# Patient Record
Sex: Male | Born: 1975 | Race: Black or African American | Hispanic: No | Marital: Married
Health system: Northeastern US, Academic
[De-identification: ages and names within clinical notes are randomized; demographics above are authoritative.]

## PROBLEM LIST (undated history)

## (undated) DIAGNOSIS — K900 Celiac disease: Secondary | ICD-10-CM

## (undated) DIAGNOSIS — K648 Other hemorrhoids: Secondary | ICD-10-CM

## (undated) DIAGNOSIS — F32A Depression, unspecified: Secondary | ICD-10-CM

## (undated) DIAGNOSIS — G4700 Insomnia, unspecified: Secondary | ICD-10-CM

## (undated) DIAGNOSIS — G4730 Sleep apnea, unspecified: Secondary | ICD-10-CM

## (undated) DIAGNOSIS — G4733 Obstructive sleep apnea (adult) (pediatric): Secondary | ICD-10-CM

## (undated) DIAGNOSIS — K219 Gastro-esophageal reflux disease without esophagitis: Secondary | ICD-10-CM

## (undated) DIAGNOSIS — I10 Essential (primary) hypertension: Secondary | ICD-10-CM

## (undated) DIAGNOSIS — M722 Plantar fascial fibromatosis: Secondary | ICD-10-CM

## (undated) HISTORY — DX: Sleep apnea, unspecified: G47.30

## (undated) HISTORY — DX: Essential (primary) hypertension: I10

## (undated) HISTORY — DX: Insomnia, unspecified: G47.00

## (undated) HISTORY — DX: Plantar fascial fibromatosis: M72.2

## (undated) HISTORY — DX: Other hemorrhoids: K64.8

## (undated) HISTORY — DX: Obstructive sleep apnea (adult) (pediatric): G47.33

## (undated) HISTORY — PX: HERNIA REPAIR: SHX51

## (undated) HISTORY — PX: HEMORRHOID BANDING: SHX5850

## (undated) HISTORY — DX: Gastro-esophageal reflux disease without esophagitis: K21.9

## (undated) HISTORY — DX: Depression, unspecified: F32.A

---

## 2005-06-04 ENCOUNTER — Emergency Department (HOSPITAL_COMMUNITY): Admission: EM | Admit: 2005-06-04 | Discharge: 2005-06-04 | Payer: Self-pay | Admitting: Emergency Medicine

## 2005-08-14 ENCOUNTER — Emergency Department (HOSPITAL_COMMUNITY): Admission: EM | Admit: 2005-08-14 | Discharge: 2005-08-14 | Payer: Self-pay | Admitting: Emergency Medicine

## 2006-04-14 ENCOUNTER — Ambulatory Visit: Payer: Self-pay | Admitting: Psychiatry

## 2006-04-14 ENCOUNTER — Inpatient Hospital Stay (HOSPITAL_COMMUNITY): Admission: AD | Admit: 2006-04-14 | Discharge: 2006-04-19 | Payer: Self-pay | Admitting: Psychiatry

## 2006-04-14 ENCOUNTER — Emergency Department (HOSPITAL_COMMUNITY): Admission: EM | Admit: 2006-04-14 | Discharge: 2006-04-14 | Payer: Self-pay | Admitting: Emergency Medicine

## 2006-04-21 ENCOUNTER — Other Ambulatory Visit (HOSPITAL_COMMUNITY): Admission: RE | Admit: 2006-04-21 | Discharge: 2006-07-20 | Payer: Self-pay | Admitting: Psychiatry

## 2006-08-02 ENCOUNTER — Emergency Department (HOSPITAL_COMMUNITY): Admission: EM | Admit: 2006-08-02 | Discharge: 2006-08-02 | Payer: Self-pay | Admitting: Emergency Medicine

## 2006-08-28 ENCOUNTER — Emergency Department (HOSPITAL_COMMUNITY): Admission: EM | Admit: 2006-08-28 | Discharge: 2006-08-28 | Payer: Self-pay | Admitting: *Deleted

## 2007-03-07 ENCOUNTER — Emergency Department (HOSPITAL_COMMUNITY): Admission: EM | Admit: 2007-03-07 | Discharge: 2007-03-07 | Payer: Self-pay | Admitting: Emergency Medicine

## 2007-10-03 ENCOUNTER — Emergency Department (HOSPITAL_COMMUNITY): Admission: EM | Admit: 2007-10-03 | Discharge: 2007-10-03 | Payer: Self-pay | Admitting: Emergency Medicine

## 2008-02-11 ENCOUNTER — Emergency Department (HOSPITAL_COMMUNITY): Admission: EM | Admit: 2008-02-11 | Discharge: 2008-02-11 | Payer: Self-pay | Admitting: Emergency Medicine

## 2008-04-24 ENCOUNTER — Emergency Department (HOSPITAL_COMMUNITY): Admission: EM | Admit: 2008-04-24 | Discharge: 2008-04-24 | Payer: Self-pay | Admitting: Emergency Medicine

## 2008-05-10 ENCOUNTER — Encounter: Admission: RE | Admit: 2008-05-10 | Discharge: 2008-05-10 | Payer: Self-pay | Admitting: Gastroenterology

## 2008-06-27 ENCOUNTER — Encounter: Admission: RE | Admit: 2008-06-27 | Discharge: 2008-06-27 | Payer: Self-pay | Admitting: General Surgery

## 2008-06-28 ENCOUNTER — Ambulatory Visit (HOSPITAL_BASED_OUTPATIENT_CLINIC_OR_DEPARTMENT_OTHER): Admission: RE | Admit: 2008-06-28 | Discharge: 2008-06-28 | Payer: Self-pay | Admitting: General Surgery

## 2010-02-19 IMAGING — CR DG CHEST 2V
2 series · 2 of 2 positions shown · non-contrast
Comparison: No priors

CLINICAL DATA: Short of breath

CHEST - 2 VIEW

[w chest pa]
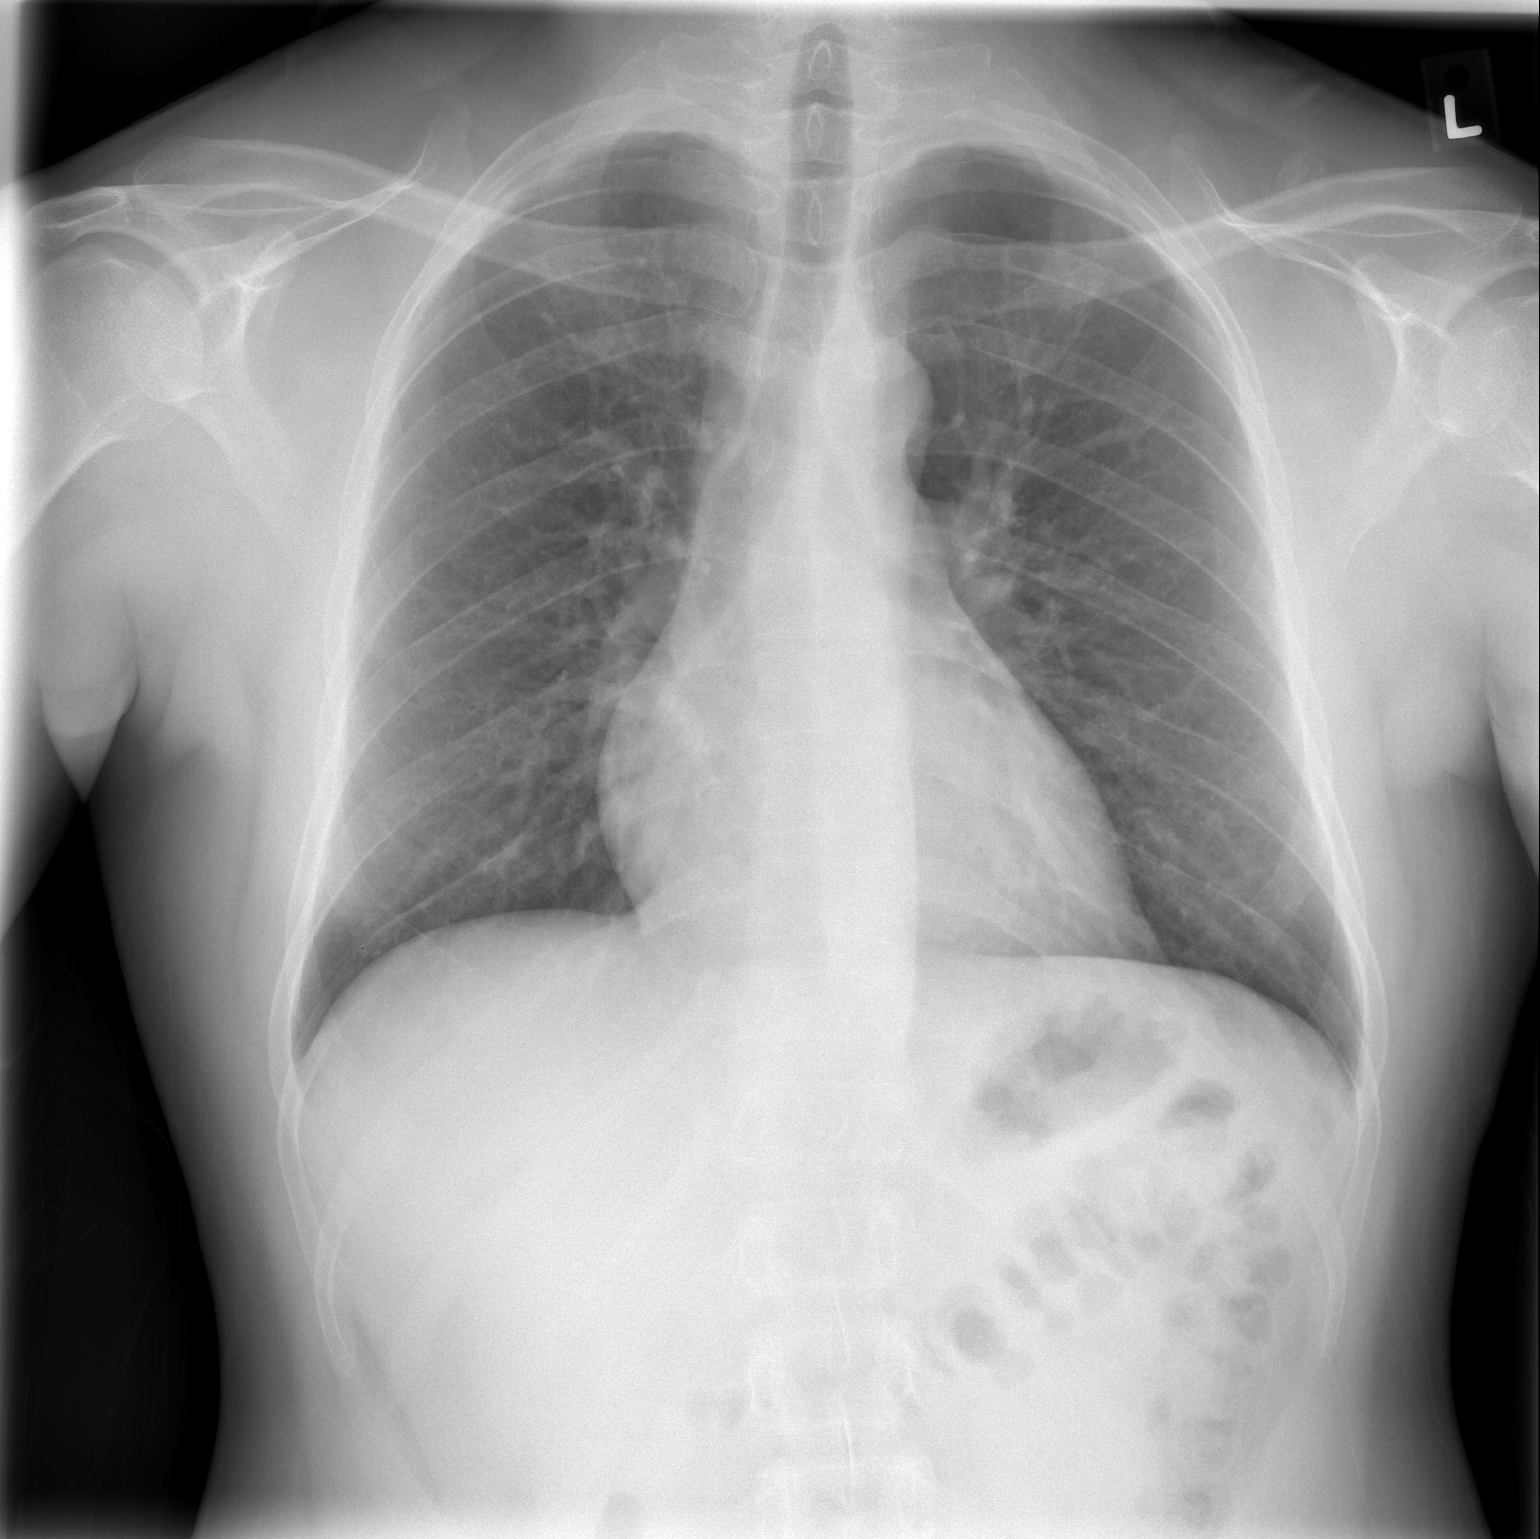

[w chest lat]
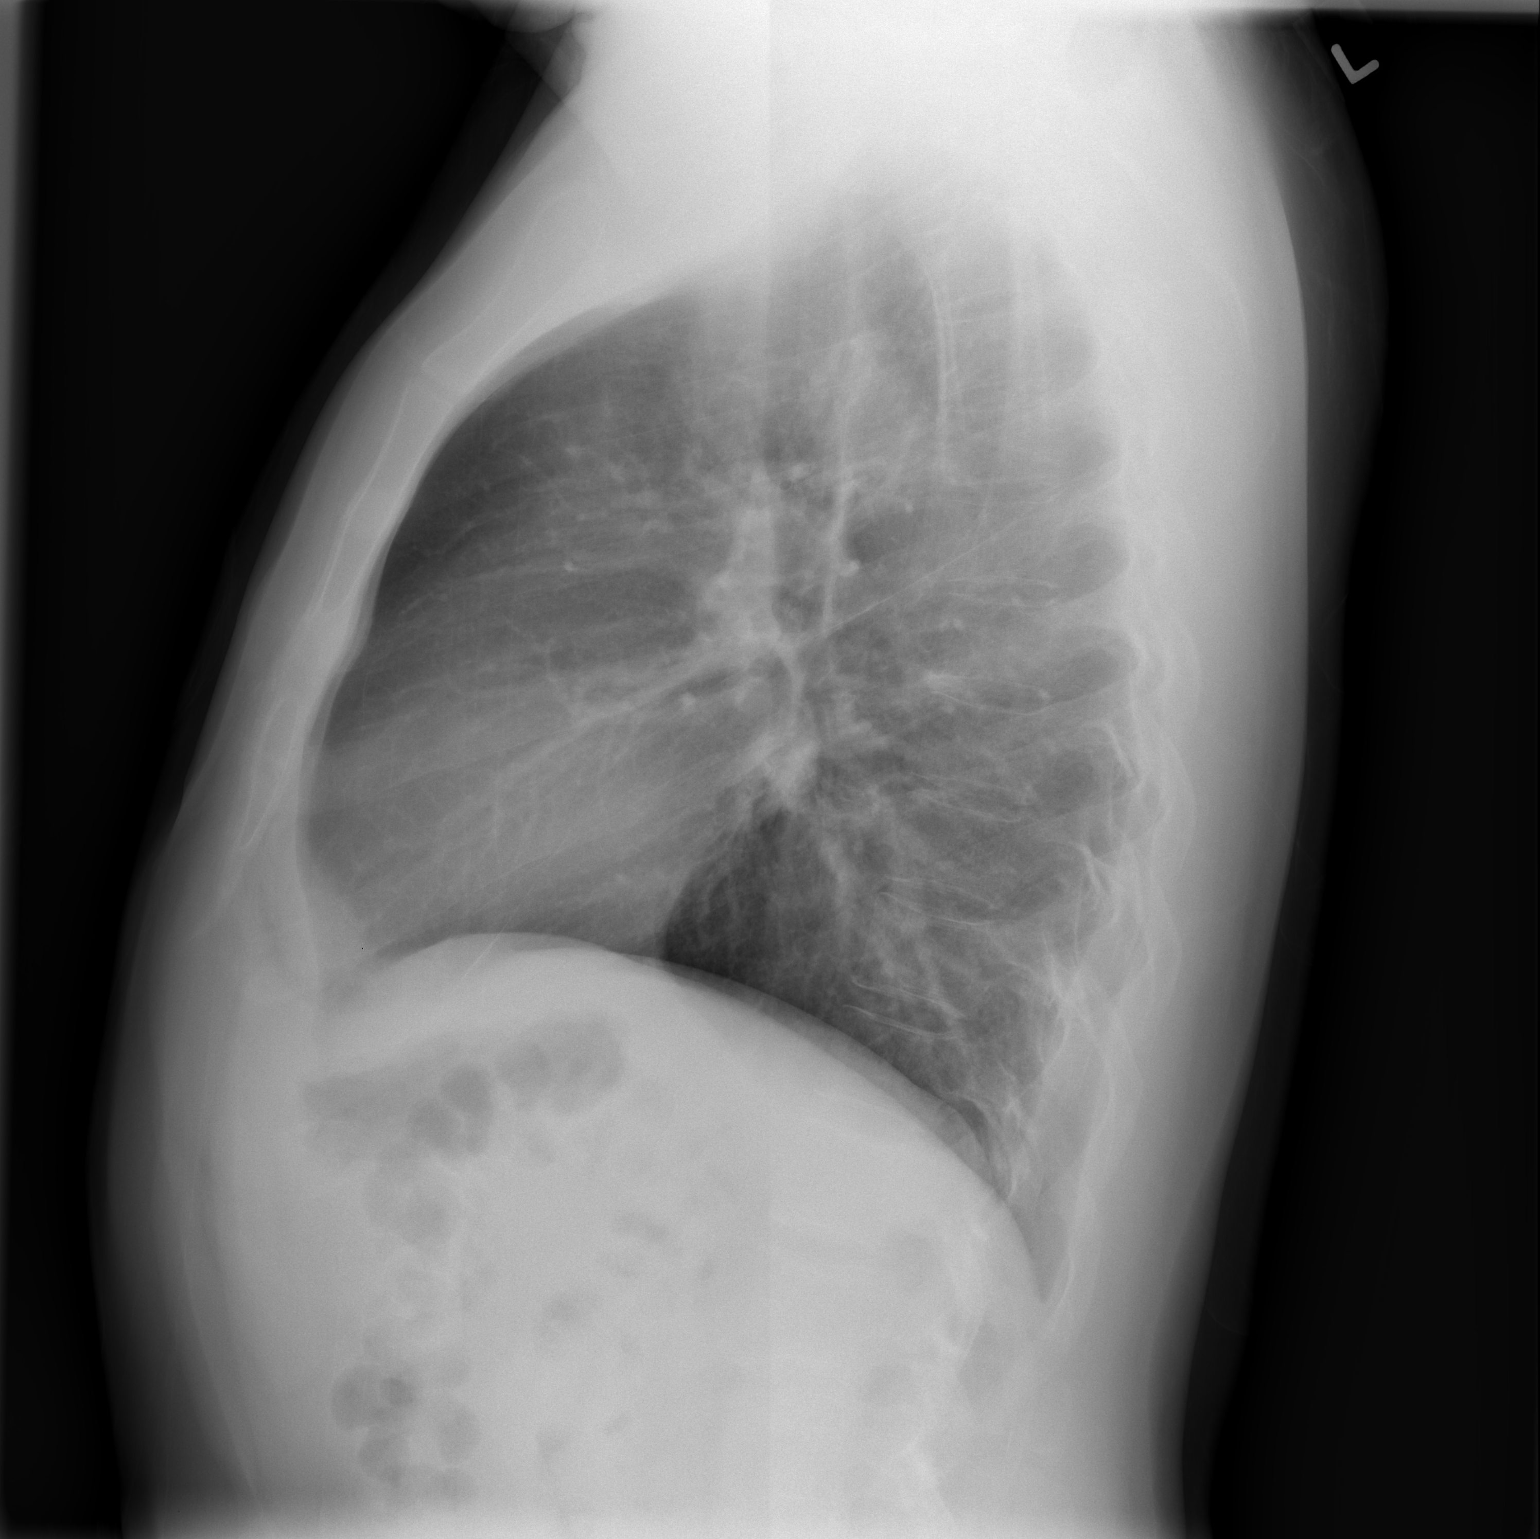

[2 of 2 positions shown; findings below may reference images not displayed]

FINDINGS: Heart and mediastinal contours normal.  Lungs clear.
Osseous structures intact.  No abnormality of the soft tissues.
IMPRESSION: No active disease.

## 2010-08-19 LAB — DIFFERENTIAL
Basophils Absolute: 0 10*3/uL (ref 0.0–0.1)
Basophils Relative: 1 % (ref 0–1)
Eosinophils Relative: 2 % (ref 0–5)
Lymphocytes Relative: 40 % (ref 12–46)
Monocytes Absolute: 0.5 10*3/uL (ref 0.1–1.0)
Monocytes Relative: 9 % (ref 3–12)
Neutrophils Relative %: 49 % (ref 43–77)

## 2010-08-19 LAB — CBC
HCT: 42.9 % (ref 39.0–52.0)
MCHC: 33.6 g/dL (ref 30.0–36.0)
Platelets: 316 10*3/uL (ref 150–400)
RDW: 13.7 % (ref 11.5–15.5)
WBC: 6.4 10*3/uL (ref 4.0–10.5)

## 2010-08-19 LAB — POCT HEMOGLOBIN-HEMACUE: Hemoglobin: 13.9 g/dL (ref 13.0–17.0)

## 2010-08-19 LAB — BASIC METABOLIC PANEL
BUN: 10 mg/dL (ref 6–23)
Calcium: 9.3 mg/dL (ref 8.4–10.5)
Creatinine, Ser: 0.85 mg/dL (ref 0.4–1.5)
GFR calc Af Amer: >60 mL/min (ref >60)

## 2010-09-16 NOTE — Op Note (Signed)
NAME:  Ethan Middleton, Ethan Middleton NO.:  [id number]   MEDICAL RECORD NO.:  [id number]          PATIENT TYPE:  AMB   LOCATION:  DSC                          FACILITY:  [facility]   PHYSICIAN:  Cherylynn Ridges, M.D.    DATE OF BIRTH:  1975-10-25   DATE OF PROCEDURE:  06/28/2008  DATE OF DISCHARGE:                               OPERATIVE REPORT   PREOPERATIVE DIAGNOSIS:  Supraumbilical ventral hernia.   POSTOPERATIVE DIAGNOSIS:  A 2.0-cm supraumbilical ventral hernia.   PROCEDURE:  Primary repair of supraumbilical ventral hernia.   SURGEON:  Marta Lamas. Lindie Spruce, MD   ASSISTANT:  None.   ANESTHESIA:  General with laryngeal airway.   ESTIMATED BLOOD LOSS:  Less 10 mL.   COMPLICATION:  None.   CONDITION:  Stable.   FINDINGS:  A 2.0-cm supraumbilical ventral defect.   INDICATIONS FOR OPERATION:  The patient is a 35 year old with a  symptomatic supraumbilical incarcerated ventral hernia who comes in now  for repair.   OPERATION:  The patient was taken to the operating room and placed on  table in the supine position.  After an adequate general laryngeal  airway anesthetic was administered, he was prepped and draped in usual  sterile manner exposing the umbilical area.   At 5 cm area, a curvilinear incision was drawn above the umbilicus using  marking pen and then a 15 blade was used to make the incision.  We  dissected down into the subcutaneous tissue where we encountered the  incarcerated hernia defect.  We dissected out the hernia defect  circumferentially, then cut off the sac at the fascial edge.  There was  minimal bleeding as we did this with electrocautery.  We grabbed the  fascial edges with Kocher clamps, then repaired using interrupted simple  stitches of 0 Novofil followed by a running back-and-forth stitch of 0  Prolene.  We irrigated with saline solution.  No mesh was used.   We closed with 3-0 Vicryl in the deep layer.  Also, the skin was closed  using running  subcuticular stitch of 4-0 Monocryl.  A 0.5% Marcaine  without epinephrine was injected into the skin.  All counts were  correct.  Dermabond, Steri-Strips, and a plug into the umbilicus  maintaining its concavity were placed as a dressing with a Tegaderm.  All counts were correct.      Cherylynn Ridges, M.D.  Electronically Signed     JOW/MEDQ  D:  06/28/2008  T:  06/29/2008  Job:  [id number]   cc:   Shirley Friar, MD

## 2010-09-19 NOTE — Discharge Summary (Signed)
NAME:  RAHEIM, BEUTLER NO.:  [id number]   MEDICAL RECORD NO.:  [id number]          PATIENT TYPE:  IPS   LOCATION:  0503                          [facility]   PHYSICIAN:  Geoffery Lyons, M.D.      DATE OF BIRTH:  [birth year]   DATE OF ADMISSION:  04/14/2006  DATE OF DISCHARGE:  04/19/2006                               DISCHARGE SUMMARY   ADMITTING DIAGNOSES:   AXIS I:  1. Alcohol and cocaine abuse; rule out dependence.  2. Marijuana dependence.  3. Cannabis dependence.  4. Mood disorder not otherwise specified.   AXIS II:  No diagnosis.   AXIS III:  No diagnosis.   AXIS IV:  Moderate.   AXIS V:  Upon admission 41; his GAF in the last year is 70.   DISCHARGE DIAGNOSES:   AXIS I:  1. Alcohol and cocaine abuse.  2. Marijuana dependence.  3. Mood disorder not otherwise specified.   AXIS II:  No diagnosis.   AXIS III:  No diagnosis.   AXIS IV:  Moderate.   AXIS V:  Upon discharge 55-60.   CHIEF COMPLAINT AND PRESENT ILLNESS:  This was the first admission to  [facility] for this 35 year old, African-American male  presented to the emergency room requesting help with detox after he  awakened in sweat in the morning.  Has been drinking alcohol for years,  drinking more than usual in the last three to four weeks, caught himself  using cocaine and beer recently at 3 o'clock in the morning on the day  of the admission.  His drinking has been exacerbated by a major conflict  with his wife over the past two months he attributes his recent drug and  alcohol use.  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  He began using cocaine to boost his energy to make  sales quotas.  He also has used marijuana for many years and over the  last six months has been drinking six beers every night to go to sleep,  taking two Tylenol-PM along with marijuana, waking up in the middle of  the night sweating and soaking the  sheets.   PAST PSYCHIATRIC HISTORY:  First inpatient stay, first detox, first time  at [facility].  He got counseling when he was in school, but not  formal treatment.  Did talk to his supervisor and was referred for  detox.   ALCOHOL AND DRUG HISTORY:  He has been using alcohol since age 35,  marijuana since approximately age 35, started using cocaine age 35, no IV  drug use.   MEDICAL HISTORY:  Noncontributory.   MEDICATIONS:  None.   Physical exam performed failed to show any acute findings.   LABORATORY WORKUP:  Sodium 140, potassium 3.9, glucose 113, BUN 4,  creatinine 1.0.  Liver enzymes:  SGOT 17, SGPT 14.  CBC:  White blood  cells 7.3, hemoglobin 15.2.   MENTAL STATUS EXAM:  A fully alert, cooperative male, pleasant, and  bright affect.  Speech was somewhat hyperverbal.  His  presence seems to  be in a colorful, elaborate way.  Mood elevated, slightly grandiose at  times.  Thought processes are logical, coherent, and relevant.  No  evidence of delusions.  No active suicidal or homicidal ideas.  No  hallucinations.  Cognition well preserved.   COURSE IN THE HOSPITAL:  He was admitted.  He was started in the  individual and group psychotherapy.  He was detoxified with Librium,  given some Trazodone for sleep.  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  Endorsed decreased sleep, traveling, when the pressures and cocaine then  alcohol, understood that his life was out of control, and he requested  help.  As the hospitalization progressed, he was detoxed, he was able to  settle down, get involved in the group and individual setting.  He heard  that his wife and his boss both were willing to work with him, willing  to give him another chance.  He was encouraged and relieved, and he says  that he loves his wife and would like to stay  in his job.  Was wanting  to leave the hospital as soon as possible as he needed to return to his  home and take care of his family as well as go back to work.  Endorsed  no active withdrawal symptoms.  We continued to work on detox, worked on  relapse prevention.  He endorsed that he was aware of the things that he  needed to do to be sure he did not go back to using.  Did endorse that  he realized how the drugs were affecting his sexual performance, and  this was creating some stress in the relationship.  He had tended to  avoid his wife, making excuses.  He continued to be an active  participant, at times quite superficial in the group setting, but in one-  on-one more real.  Endorsed awareness of his progress, said that he was  wanting to address his addictive behavior in a more rational way,  behavior modification, more so than going to 12-step, felt that 12-step  was not going to work for him.  December 17th, he was in full contact  with reality.  There were no active suicidal or homicidal ideas, no  hallucinations, no delusions, no acute withdrawal, said that he was  ready to be discharged from the unit, will come back to [facility] and  continue to work on self-long term abstinence.   DISCHARGE MEDICATIONS:  None.   FOLLOWUP:  [facility] outpatient.      Geoffery Lyons, M.D.  Electronically Signed     IL/MEDQ  D:  05/11/2006  T:  05/11/2006  Job:  [id number]

## 2010-09-19 NOTE — H&P (Signed)
NAME:  Ethan Middleton, Ethan Middleton NO.:  [id number]   MEDICAL RECORD NO.:  [id number]          PATIENT TYPE:  IPS   LOCATION:  0503                          [facility]ACILITY:  [facility]   PHYSICIAN:  Geoffery Lyons, M.D.      DATE OF BIRTH:  [birth year]   DATE OF ADMISSION:  04/14/2006  DATE OF DISCHARGE:                       PSYCHIATRIC ADMISSION ASSESSMENT   IDENTIFYING INFORMATION:  This is a 35 year old African American male  who is single.  This is a voluntary admission.   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  He has reported that he has been drinking alcohol for  years but drinking more than usual in the last 3-4 weeks and found  himself using cocaine and beer as recently as 3 o'clock in the morning  on the day of admission.  He reports that his drinking has been  exacerbated by marital conflict with his wife over the past 2 months  which he attributes to his recent drug and alcohol use which has  escalated during that time.  He reports that he has been ignoring her.  They had not had much intimacy in the marriage and this has been a  dissatisfied for her.  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  Has always used marijuana for  many years and now has developed a pattern over the course of the last  several months of drinking six beers every night to go to sleep, taking  two Tylenol PM's along with marijuana.  He is waking up in the middle of  the night sweating and soaking the sheets.  He denies suicidal or  homicidal thoughts.  Denies any prior history of detoxes.   PAST PSYCHIATRIC HISTORY:  This is the patient's first inpatient  admission, first detox, first admission to [facility].  He has a history of getting  counseling when he was in schools,  but no formal treatment before.  He did talk to his supervisor and was  referred for detox by his employee assistance program at work.  He  denies any history of mania or mood problems.  No history of suicidal  thoughts or attempts.  Never treated with medications.  He reports using  alcohol since age 70 and cannabis since approximately age 30.  Started  using cocaine approximately age 67 and has used for a couple of years  now.  No IV drug use.  He is either smoking or snorting it.   SOCIAL HISTORY:  The patient currently lives with his fiancee and two  children at home.  He was born and raised in [place] and reports  being exposed to a lot of drug use during his childhood.  He had his  first child at age 17.  Works a full-time job with stable employment and  no current legal problems.   FAMILY  HISTORY:  Remarkable for a family history of substance use.  Alcohol and drug history as noted above.  No history of IV drug use.   The patient has no primary care physician. He denies any medical  problems.  Takes no medications.   PAST MEDICAL HISTORY:  Remarkable for no hospitalizations.  No  surgeries.  No history of seizures, blackouts or memory loss.  No  previous psychiatric history.  He does abuse tobacco smoking between one  to two packs of cigarettes daily.   DRUG ALLERGIES:  None.   POSITIVE PHYSICAL FINDINGS:  The patient's full physical exam was done  in the emergency room.  It is noted in the record.  Is entirely  unremarkable.  This is a well-nourished, well-developed [place] American  male who appears to be his stated age, 5 feet 7 inches tall, 167 pounds,  trim build,  muscular, afebrile.  Pulse 97, respirations 20, blood  pressure 133/77, pulse ox was 100%.   DIAGNOSTIC STUDIES:  The patient's urinalysis was within normal limits.  Urine drug screen was positive for both cocaine and marijuana.  Alcohol  level was less than 5.  A CBC  is entirely normal.  Hemoglobin 15.2,  hematocrit 44.7, MCV 84.9 and platelets 347,000.  His chemistries were  normal.  BUN 4, creatinine 1.0.  Liver enzymes; SGOT 17, SGPT 14,  alkaline phosphatase 60 and total bilirubin 0.8.   MENTAL STATUS EXAM:  Fully alert male, pleasant, cooperative, has a  bright affect.  Speech is somewhat hyperverbal.  Expresses himself with  a rather colorful and elaborate story.  It initially appears that he  might be a little bit hypomanic, however, it is possible that this is  his baseline.  Mood is a bit elevated, slightly grandiose at times.  Thought process is logical, coherent.  No evidence of paranoia or flight  of ideas.  No suicidal or homicidal thought.  Cognitively he is intact  and oriented x3.  No pressure to his speech, fluent, articulate.  Thoughts are well organized.  Insight is adequate.  Impulse control and  judgment appear within normal limits.   AXIS I:  ETOH abuse and dependence.  Cocaine abuse, rule out dependence.  AXIS II:  Deferred.  AXIS III:  No diagnosis.  AXIS IV:  Moderate marital friction and family conflict.  AXIS V:  Current 39, past year 48.   PLAN:  To voluntarily admit the patient for a safe detox in 5 days.  We  started him on a Librium protocol which he is tolerating well.  He is  requesting help with counseling for his marriage and we are going to  attempt to get a family session with his wife.  We have explained the  use of the medications.  Meanwhile while we are detoxing him we will  continue to just observe his mood and interactions for any signs of  lability.  The patient has expressed his agreement with the plan and we  are going to work with him on follow-up services after he leaves here to  maintain sobriety and relapse prevention.  Estimated length of stay is 5  days.      Margaret A. Scott, N.P.      Geoffery Lyons, M.D.  Electronically Signed   MAS/MEDQ  D:  04/15/2006  T:  04/15/2006  Job:   [id number]

## 2011-01-29 LAB — D-DIMER, QUANTITATIVE: D-Dimer, Quant: 0.32

## 2011-02-06 LAB — COMPREHENSIVE METABOLIC PANEL
ALT: 24 U/L (ref 0–53)
AST: 26 U/L (ref 0–37)
Albumin: 4 g/dL (ref 3.5–5.2)
Alkaline Phosphatase: 62 U/L (ref 39–117)
Calcium: 9.3 mg/dL (ref 8.4–10.5)
Creatinine, Ser: 1 mg/dL (ref 0.4–1.5)
Potassium: 3.7 mEq/L (ref 3.5–5.1)
Sodium: 140 mEq/L (ref 135–145)
Total Protein: 7.1 g/dL (ref 6.0–8.3)

## 2011-02-06 LAB — CBC
HCT: 44 % (ref 39.0–52.0)
MCHC: 33.3 g/dL (ref 30.0–36.0)
MCV: 84.3 fL (ref 78.0–100.0)
RBC: 5.21 MIL/uL (ref 4.22–5.81)
WBC: 9.3 10*3/uL (ref 4.0–10.5)

## 2011-02-06 LAB — DIFFERENTIAL
Basophils Absolute: 0 10*3/uL (ref 0.0–0.1)
Lymphocytes Relative: 25 % (ref 12–46)
Lymphs Abs: 2.3 10*3/uL (ref 0.7–4.0)
Monocytes Relative: 10 % (ref 3–12)

## 2011-02-06 LAB — PROTIME-INR
INR: 0.9 (ref 0.00–1.49)
Prothrombin Time: 12.7 seconds (ref 11.6–15.2)

## 2011-02-06 LAB — LIPASE, BLOOD: Lipase: 24 U/L (ref 11–59)

## 2012-12-11 ENCOUNTER — Encounter (HOSPITAL_COMMUNITY): Payer: Self-pay | Admitting: *Deleted

## 2012-12-11 ENCOUNTER — Emergency Department (HOSPITAL_COMMUNITY)
Admission: EM | Admit: 2012-12-11 | Discharge: 2012-12-11 | Disposition: A | Discharge status: Home / Self Care | Payer: BC Managed Care – PPO | Attending: Emergency Medicine | Admitting: Emergency Medicine

## 2012-12-11 DIAGNOSIS — K089 Disorder of teeth and supporting structures, unspecified: Secondary | ICD-10-CM | POA: Insufficient documentation

## 2012-12-11 DIAGNOSIS — K0889 Other specified disorders of teeth and supporting structures: Secondary | ICD-10-CM

## 2012-12-11 DIAGNOSIS — Z8719 Personal history of other diseases of the digestive system: Secondary | ICD-10-CM | POA: Insufficient documentation

## 2012-12-11 DIAGNOSIS — Z792 Long term (current) use of antibiotics: Secondary | ICD-10-CM | POA: Insufficient documentation

## 2012-12-11 HISTORY — DX: Celiac disease: K90.0

## 2012-12-11 MED ORDER — PENICILLIN V POTASSIUM 250 MG PO TABS: 250.0000 mg | ORAL_TABLET | Freq: Four times a day (QID) | ORAL | Status: AC

## 2012-12-11 MED ORDER — HYDROCODONE-ACETAMINOPHEN 5-325 MG PO TABS: 1.0000 | ORAL_TABLET | ORAL | Status: DC | PRN

## 2012-12-11 NOTE — ED Notes (Signed)
Pt reports sore throat started yesterday, this morning nasal stuffiness and reports left sided facial swelling.

## 2012-12-11 NOTE — ED Provider Notes (Signed)
  CSN: [id number]     Arrival date & time 12/11/12  1344 History     First MD Initiated Contact with Patient 12/11/12 1355     Chief Complaint  Patient presents with  . Sore Throat   (Consider location/radiation/quality/duration/timing/severity/associated sxs/prior Treatment) HPI Comments: Pt states that he started with a sore throat yesterday and now he is having pain in his left lower gums and in to his WUJ:WJXBJY fever:pt states that he thinks that he has swelling to the left side of his face:denies fever  Patient is a 37 y.o. male presenting with pharyngitis. The history is provided by the patient. No language interpreter was used.  Sore Throat This is a new problem. The current episode started today. The problem occurs constantly. The problem has been unchanged. Nothing aggravates the symptoms. He has tried nothing for the symptoms.    Past Medical History  Diagnosis Date  . Celiac disease    History reviewed. No pertinent past surgical history. No family history on file. History  Substance Use Topics  . Smoking status: Not on file  . Smokeless tobacco: Not on file  . Alcohol Use: Not on file    Review of Systems  Constitutional: Negative.   Respiratory: Negative.   Cardiovascular: Negative.     Allergies  Review of patient's allergies indicates no known allergies.  Home Medications   Current Outpatient Rx  Name  Route  Sig  Dispense  Refill  . HYDROcodone-acetaminophen (NORCO/VICODIN) 5-325 MG per tablet   Oral   Take 1 tablet by mouth every 4 (four) hours as needed for pain.   10 tablet   0   . penicillin v potassium (VEETID) 250 MG tablet   Oral   Take 1 tablet (250 mg total) by mouth 4 (four) times daily.   40 tablet   0    BP 119/94  Pulse 62  Temp(Src) 98.8 F (37.1 C) (Oral)  Resp 20  SpO2 100% Physical Exam  Nursing note and vitals reviewed. Constitutional: He appears well-developed and well-nourished.  HENT:  Right Ear: External ear  normal.  Left Ear: External ear normal.  Dental decay:no facial swelling noted:left lower gum tenderness  Eyes: Conjunctivae and EOM are normal. Pupils are equal, round, and reactive to light.  Neck: Normal range of motion. Neck supple.  Cardiovascular: Normal rate and regular rhythm.   Pulmonary/Chest: Effort normal and breath sounds normal.    ED Course   Procedures (including critical care time)  Labs Reviewed - No data to display No results found. 1. Toothache     MDM  Pt treated with antibiotics and to follow up with his dentist  Teressa Lower, NP 12/11/12 1445

## 2012-12-11 NOTE — ED Provider Notes (Signed)
Medical screening examination/treatment/procedure(s) were performed by non-physician practitioner and as supervising physician I was immediately available for consultation/collaboration.  Dandrae Kustra M Naz Denunzio, MD 12/11/12 1559 

## 2013-01-22 ENCOUNTER — Emergency Department (HOSPITAL_COMMUNITY): Payer: BC Managed Care – PPO

## 2013-01-22 ENCOUNTER — Encounter (HOSPITAL_COMMUNITY): Payer: Self-pay | Admitting: Emergency Medicine

## 2013-01-22 ENCOUNTER — Emergency Department (HOSPITAL_COMMUNITY)
Admission: EM | Admit: 2013-01-22 | Discharge: 2013-01-22 | Disposition: A | Discharge status: Home / Self Care | Payer: BC Managed Care – PPO | Attending: Emergency Medicine | Admitting: Emergency Medicine

## 2013-01-22 DIAGNOSIS — M549 Dorsalgia, unspecified: Secondary | ICD-10-CM

## 2013-01-22 DIAGNOSIS — F17200 Nicotine dependence, unspecified, uncomplicated: Secondary | ICD-10-CM | POA: Insufficient documentation

## 2013-01-22 DIAGNOSIS — Z8719 Personal history of other diseases of the digestive system: Secondary | ICD-10-CM | POA: Insufficient documentation

## 2013-01-22 DIAGNOSIS — R109 Unspecified abdominal pain: Secondary | ICD-10-CM

## 2013-01-22 DIAGNOSIS — R1032 Left lower quadrant pain: Secondary | ICD-10-CM | POA: Insufficient documentation

## 2013-01-22 DIAGNOSIS — M545 Low back pain, unspecified: Secondary | ICD-10-CM | POA: Insufficient documentation

## 2013-01-22 LAB — CBC WITH DIFFERENTIAL/PLATELET
Basophils Relative: 0 % (ref 0–1)
Eosinophils Relative: 3 % (ref 0–5)
HCT: 44.3 % (ref 39.0–52.0)
Hemoglobin: 14.8 g/dL (ref 13.0–17.0)
MCHC: 33.4 g/dL (ref 30.0–36.0)
Monocytes Absolute: 0.5 10*3/uL (ref 0.1–1.0)
Monocytes Relative: 10 % (ref 3–12)
Neutro Abs: 2.6 10*3/uL (ref 1.7–7.7)
Platelets: 306 10*3/uL (ref 150–400)
RBC: 5.29 MIL/uL (ref 4.22–5.81)
WBC: 5.2 10*3/uL (ref 4.0–10.5)

## 2013-01-22 LAB — URINALYSIS, ROUTINE W REFLEX MICROSCOPIC
Bilirubin Urine: NEGATIVE
Hgb urine dipstick: NEGATIVE
Ketones, ur: NEGATIVE mg/dL
Nitrite: NEGATIVE
Protein, ur: 100 mg/dL — AB
Urobilinogen, UA: 0.2 mg/dL (ref 0.0–1.0)
pH: 5.5 (ref 5.0–8.0)

## 2013-01-22 LAB — LIPASE, BLOOD: Lipase: 26 U/L (ref 11–59)

## 2013-01-22 LAB — COMPREHENSIVE METABOLIC PANEL
ALT: 14 U/L (ref 0–53)
BUN: 9 mg/dL (ref 6–23)
CO2: 25 mEq/L (ref 19–32)
Calcium: 9.3 mg/dL (ref 8.4–10.5)
GFR calc Af Amer: >90 mL/min (ref >90)
Sodium: 137 mEq/L (ref 135–145)
Total Protein: 7.7 g/dL (ref 6.0–8.3)

## 2013-01-22 LAB — URINE MICROSCOPIC-ADD ON

## 2013-01-22 MED ORDER — CYCLOBENZAPRINE HCL 5 MG PO TABS: 5.0000 mg | ORAL_TABLET | Freq: Three times a day (TID) | ORAL | Status: DC | PRN

## 2013-01-22 MED ORDER — OXYCODONE-ACETAMINOPHEN 5-325 MG PO TABS: 1.0000 | ORAL_TABLET | Freq: Four times a day (QID) | ORAL | Status: DC | PRN

## 2013-01-22 MED ORDER — IOHEXOL 300 MG/ML  SOLN
50.0000 mL | Freq: Once | INTRAMUSCULAR | Status: AC | PRN
  Administered 2013-01-22: 50 mL via ORAL

## 2013-01-22 MED ORDER — MORPHINE SULFATE 4 MG/ML IJ SOLN
4.0000 mg | Freq: Once | INTRAMUSCULAR | Status: AC
  Administered 2013-01-22: 4 mg via INTRAVENOUS
  Filled 2013-01-22: qty 1

## 2013-01-22 MED ORDER — IOHEXOL 300 MG/ML  SOLN
100.0000 mL | Freq: Once | INTRAMUSCULAR | Status: AC | PRN
  Administered 2013-01-22: 100 mL via INTRAVENOUS

## 2013-01-22 MED ORDER — ONDANSETRON HCL 4 MG/2ML IJ SOLN
4.0000 mg | Freq: Once | INTRAMUSCULAR | Status: AC
  Administered 2013-01-22: 4 mg via INTRAVENOUS
  Filled 2013-01-22: qty 2

## 2013-01-22 NOTE — ED Notes (Signed)
Patient transported to CT 

## 2013-01-22 NOTE — ED Provider Notes (Signed)
CSN: [id number]     Arrival date & time 01/22/13  0736 History   First MD Initiated Contact with Patient 01/22/13 [phone]     Chief Complaint  Patient presents with  . Abdominal Pain   (Consider location/radiation/quality/duration/timing/severity/associated sxs/prior Treatment) HPI Pt presents with c/o left lower back pain with some left sided lower abdominal pain as well.  Pt states symptoms have been ongoing for the past week, he is traveling tomorrow for work which prompted ED visit today.  No vomiting or fever.  Has passed some looser stools than normal yesterday- no change in the pain.  Pain is worse with sitting up from a lying position and with palpation.  No blood in stools.  No dysuria.  There are no other associated systemic symptoms, there are no other alleviating or modifying factors.   Past Medical History  Diagnosis Date  . Celiac disease    History reviewed. No pertinent past surgical history. No family history on file. History  Substance Use Topics  . Smoking status: Current Every Day Smoker  . Smokeless tobacco: Not on file  . Alcohol Use: No    Review of Systems ROS reviewed and all otherwise negative except for mentioned in HPI  Allergies  Gluten meal  Home Medications   Current Outpatient Rx  Name  Route  Sig  Dispense  Refill  . cyclobenzaprine (FLEXERIL) 5 MG tablet   Oral   Take 1 tablet (5 mg total) by mouth 3 (three) times daily as needed for muscle spasms.   20 tablet   0   . oxyCODONE-acetaminophen (PERCOCET/ROXICET) 5-325 MG per tablet   Oral   Take 1-2 tablets by mouth every 6 (six) hours as needed for pain.   15 tablet   0    BP 114/60  Pulse 54  Temp(Src) 97.6 F (36.4 C) (Oral)  Resp 16  SpO2 100% Vitals reviewed Physical Exam Physical Examination: General appearance - alert, well appearing, and in no distress Mental status - alert, oriented to person, place, and time Eyes - no conjunctival injection, no scleral icterus Mouth -  mucous membranes moist, pharynx normal without lesions Chest - clear to auscultation, no wheezes, rales or rhonchi, symmetric air entry Heart - normal rate, regular rhythm, normal S1, S2, no murmurs, rubs, clicks or gallops Abdomen - soft, left lower abdominal tenderness, no gaurding or rebound, nabs, nondistended, no masses or organomegaly Back- ttp over left lumbar paraspinous muscles, no midline tenderness to palpation Extremities - peripheral pulses normal, no pedal edema, no clubbing or cyanosis Skin - normal coloration and turgor, no rashes  ED Course  Procedures (including critical care time) Labs Review Labs Reviewed  URINALYSIS, ROUTINE W REFLEX MICROSCOPIC - Abnormal; Notable for the following:    Color, Urine AMBER (*)    APPearance CLOUDY (*)    Specific Gravity, Urine 1.039 (*)    Protein, ur 100 (*)    All other components within normal limits  COMPREHENSIVE METABOLIC PANEL - Abnormal; Notable for the following:    Glucose, Bld 103 (*)    All other components within normal limits  CBC WITH DIFFERENTIAL  LIPASE, BLOOD  URINE MICROSCOPIC-ADD ON   Imaging Review Ct Abdomen Pelvis W Contrast  01/22/2013   *RADIOLOGY REPORT*  Clinical Data: Abdomen pain.  Loose stool.  History of celiac disease  CT ABDOMEN AND PELVIS WITH CONTRAST  Technique:  Multidetector CT imaging of the abdomen and pelvis was performed following the standard protocol during bolus administration of  intravenous contrast.  Contrast: OMNIPAQUE IOHEXOL 300 MG/ML  SOLN  Comparison: None.  Findings: The liver, spleen, pancreas, gallbladder, adrenal glands and kidneys are normal.  The aorta is normal.  There is no abdominal lymphadenopathy.  There is no small bowel obstruction or diverticulitis.  The appendix is normal.  Images of the pelvis demonstrate fluid filled bladder without abnormality.  There is mild dependent atelectasis of the posterior lungs.  No acute abnormality is identified within the visualized  bones.  IMPRESSION: No acute abnormality identified in the abdomen and pelvis   Original Report Authenticated By: Sherian Rein, M.D.    MDM   1. Back pain   2. Abdominal pain    Pt presenting with left lower back and left lower abdominal pain.  Labs and CT scan are reassuring, no evidence of colitis or diverticulitis.  Pain in back seems more musculoskeletal in nature.  Will d/w with meds- encouraged outpatient f/u if pain continues.  No signs or symptoms of cauda equina.  Discharged with strict return precautions.  Pt agreeable with plan.    Ethelda Chick, MD 01/22/13 1346

## 2013-01-22 NOTE — ED Notes (Signed)
Per pt, states abdominal pain that radiated to back-loose stool-thought it was gas but has not gotten better

## 2013-02-10 ENCOUNTER — Encounter (HOSPITAL_COMMUNITY): Payer: Self-pay | Admitting: Emergency Medicine

## 2013-02-10 ENCOUNTER — Emergency Department (HOSPITAL_COMMUNITY)
Admission: EM | Admit: 2013-02-10 | Discharge: 2013-02-10 | Disposition: A | Discharge status: Home / Self Care | Payer: BC Managed Care – PPO | Attending: Emergency Medicine | Admitting: Emergency Medicine

## 2013-02-10 DIAGNOSIS — M545 Low back pain, unspecified: Secondary | ICD-10-CM | POA: Insufficient documentation

## 2013-02-10 DIAGNOSIS — H938X9 Other specified disorders of ear, unspecified ear: Secondary | ICD-10-CM | POA: Insufficient documentation

## 2013-02-10 DIAGNOSIS — Z8781 Personal history of (healed) traumatic fracture: Secondary | ICD-10-CM | POA: Insufficient documentation

## 2013-02-10 DIAGNOSIS — M549 Dorsalgia, unspecified: Secondary | ICD-10-CM

## 2013-02-10 DIAGNOSIS — Z8719 Personal history of other diseases of the digestive system: Secondary | ICD-10-CM | POA: Insufficient documentation

## 2013-02-10 DIAGNOSIS — Z87891 Personal history of nicotine dependence: Secondary | ICD-10-CM | POA: Insufficient documentation

## 2013-02-10 MED ORDER — OXYCODONE-ACETAMINOPHEN 5-325 MG PO TABS: 1.0000 | ORAL_TABLET | ORAL | Status: DC | PRN

## 2013-02-10 NOTE — ED Provider Notes (Signed)
CSN: [id number]     Arrival date & time 02/10/13  1238 History   This chart was scribed for non-physician practitioner Sharilyn Sites, PA-C working with Darlys Gales, MD by Joaquin Music, ED Scribe. This patient was seen in room TR06C/TR06C and the patient's care was started at 1:04 PM .   Chief Complaint  Patient presents with  . Back Pain    The history is provided by the patient. No language interpreter was used.   HPI Comments: Ethan Middleton is a 37 y.o. male who presents to the Emergency Department complaining of ongoing worsening left lower back pain. 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 States he has a history of similar pain which is often exacerbated by prolonged sitting, standing, or exertional activity. Pt states he has had to travel an excessive amount for his job recently and the pain has worsened.  Pt states he has tingling sensation radiating down LLE but does not descend past the knee. No numbness or weakness. No loss of bowel or bladder function. Denies any LE edema or calf pain.  No chest pain, SOB, or palpitations.  He states his wife has degenerative disc and he took some of her pain meds with no relief.    Pt also complains of small "knot" behind his left ear.  States he has had something similar in the past which required I&D.  Notes there was pus draining from it the other day but none since.  No hearing disturbance.  No fevers, sweats, or chills.   Past Medical History  Diagnosis Date  . Celiac disease    History reviewed. No pertinent past surgical history. History reviewed. No pertinent family history. History  Substance Use Topics  . Smoking status: Former Games developer  . Smokeless tobacco: Not on file  . Alcohol Use: Yes     Comment: rare    Review of Systems  Musculoskeletal: Positive for myalgias.  All other systems reviewed and are negative.    Allergies  Gluten  meal  Home Medications   Current Outpatient Rx  Name  Route  Sig  Dispense  Refill  . cyclobenzaprine (FLEXERIL) 5 MG tablet   Oral   Take 1 tablet (5 mg total) by mouth 3 (three) times daily as needed for muscle spasms.   20 tablet   0   . oxyCODONE-acetaminophen (PERCOCET/ROXICET) 5-325 MG per tablet   Oral   Take 1-2 tablets by mouth every 6 (six) hours as needed for pain.   15 tablet   0    Triage Vitals:BP 126/83  Pulse 84  Temp(Src) 98.1 F (36.7 C) (Oral)  Resp 15  Ht 5\' 7"  (1.702 m)  Wt 194 lb 14.4 oz (88.406 kg)  BMI 30.52 kg/m2  SpO2 96%  Physical Exam  Nursing note and vitals reviewed. Constitutional: He is oriented to person, place, and time. He appears well-developed and well-nourished. No distress.  HENT:  Head: Normocephalic and atraumatic.  Mouth/Throat: Oropharynx is clear and moist.  Small cystic structure behind left ear; no central fluctuance or active drainage; no surrounding swelling, erythema, or induration  Eyes: Conjunctivae and EOM are normal. Pupils are equal, round, and reactive to light.  Neck: Normal range of motion. Neck supple.  Cardiovascular: Normal rate, regular rhythm and normal heart sounds.   Pulmonary/Chest: Effort normal and breath sounds normal. No respiratory distress. He has no wheezes.  Musculoskeletal: Normal range of  motion.       Lumbar back: He exhibits tenderness, bony tenderness and pain. He exhibits normal range of motion, no swelling, no edema, no deformity, no laceration, no spasm and normal pulse.       Back:  Tenderness to palpation over left SI joint; no step off, crepitus, or gross deformity; limited ROM of hip due to pain; no calf pain, asymmetry, or palpable cord; negative Homan's sign; strong distal pulse; sensation intact; ambulating unassisted without difficulty or limp  Neurological: He is alert and oriented to person, place, and time.  Skin: Skin is warm and dry. He is not diaphoretic.  Psychiatric: He has  a normal mood and affect.    ED Course  Procedures  DIAGNOSTIC STUDIES: Oxygen Saturation is 96% on RA, normal by my interpretation.    COORDINATION OF CARE: 1:10 PM-Discussed treatment plan which includes Percocet and F/U with Orthopedist. Pt agreed to plan.   Labs Review Labs Reviewed - No data to display Imaging Review No results found.   MDM   1. Back pain    Exacerbation of chronic back pain.  No signs/sx concerning for DVT/PE.  No concern for cauda equina.  Cyst behind left ear non-draining without signs of infection or cellulitis-- encouraged warm compresses, FU if no improvement.  Rx percocet.  Pt requests FU with orthopedics-- referral given.  Discussed plan with pt, they agreed.  Return precautions advised.  I personally performed the services described in this documentation, which was scribed in my presence. The recorded information has been reviewed and is accurate.  Garlon Hatchet, PA-C 02/10/13 1349

## 2013-02-10 NOTE — ED Notes (Addendum)
Pt reports one of his legs is shorter and he has had history of pain that he saw ortho md several years ago and was managed with steroid injections. Recently he had to travel a lot for his job and pain has returned in L hip. Ambulatory, cms intact. Took wifes pain meds with no relief. Also c/o sore behind L ear that is painful.

## 2013-02-10 NOTE — ED Provider Notes (Signed)
Medical screening examination/treatment/procedure(s) were performed by non-physician practitioner and as supervising physician I was immediately available for consultation/collaboration.  Darlys Gales, MD 02/10/13 2110

## 2013-04-30 ENCOUNTER — Emergency Department (HOSPITAL_COMMUNITY): Payer: BC Managed Care – PPO

## 2013-04-30 ENCOUNTER — Emergency Department (HOSPITAL_COMMUNITY)
Admission: EM | Admit: 2013-04-30 | Discharge: 2013-04-30 | Disposition: A | Discharge status: Home / Self Care | Payer: BC Managed Care – PPO | Attending: Emergency Medicine | Admitting: Emergency Medicine

## 2013-04-30 ENCOUNTER — Encounter (HOSPITAL_COMMUNITY): Payer: Self-pay | Admitting: Emergency Medicine

## 2013-04-30 DIAGNOSIS — K900 Celiac disease: Secondary | ICD-10-CM | POA: Insufficient documentation

## 2013-04-30 DIAGNOSIS — R5381 Other malaise: Secondary | ICD-10-CM | POA: Insufficient documentation

## 2013-04-30 DIAGNOSIS — IMO0001 Reserved for inherently not codable concepts without codable children: Secondary | ICD-10-CM | POA: Insufficient documentation

## 2013-04-30 DIAGNOSIS — F17200 Nicotine dependence, unspecified, uncomplicated: Secondary | ICD-10-CM | POA: Insufficient documentation

## 2013-04-30 DIAGNOSIS — B9789 Other viral agents as the cause of diseases classified elsewhere: Secondary | ICD-10-CM | POA: Insufficient documentation

## 2013-04-30 DIAGNOSIS — J111 Influenza due to unidentified influenza virus with other respiratory manifestations: Secondary | ICD-10-CM

## 2013-04-30 DIAGNOSIS — J3489 Other specified disorders of nose and nasal sinuses: Secondary | ICD-10-CM | POA: Insufficient documentation

## 2013-04-30 DIAGNOSIS — R509 Fever, unspecified: Secondary | ICD-10-CM | POA: Insufficient documentation

## 2013-04-30 MED ORDER — BENZONATATE 100 MG PO CAPS: 100.0000 mg | ORAL_CAPSULE | Freq: Three times a day (TID) | ORAL | Status: DC | PRN

## 2013-04-30 MED ORDER — FLUTICASONE PROPIONATE 50 MCG/ACT NA SUSP: 2.0000 | Freq: Every day | NASAL | Status: DC

## 2013-04-30 NOTE — ED Provider Notes (Signed)
CSN: [id number]     Arrival date & time 04/30/13  1740 History   First MD Initiated Contact with Patient 04/30/13 1919     Chief Complaint  Patient presents with  . Generalized Body Aches  . Cough   (Consider location/radiation/quality/duration/timing/severity/associated sxs/prior Treatment) Patient is a 37 y.o. male presenting with cough. The history is provided by the patient. No language interpreter was used.  Cough Cough characteristics:  Non-productive and productive Sputum characteristics:  Yellow Severity:  Moderate Duration:  5 days Timing:  Intermittent Associated symptoms: fever   Associated symptoms: no wheezing   Fever:    Duration:  5 days   Timing:  Intermittent Risk factors: recent travel   pt is a 37 year old male who has been having cough, congestion, fever and body aches for approx the last 5-6 days. He reports that he traveled out of state and one of his family members had similar symptoms. He reports nasal congestion and ongoing cough. He denies nausea, vomiting or diarrhea. He denies shortness of breath or difficulty breathing. No history of asthma, he reports that he is trying to quit smoking and is down to 1-2 cigs/day.     Past Medical History  Diagnosis Date  . Celiac disease    History reviewed. No pertinent past surgical history. History reviewed. No pertinent family history. History  Substance Use Topics  . Smoking status: Current Every Day Smoker -- 0.50 packs/day  . Smokeless tobacco: Not on file  . Alcohol Use: Yes     Comment: rare    Review of Systems  Constitutional: Positive for fever and fatigue.  Respiratory: Positive for cough. Negative for wheezing and stridor.   Gastrointestinal: Negative for nausea, vomiting, abdominal pain and diarrhea.  All other systems reviewed and are negative.    Allergies  Gluten meal  Home Medications   Current Outpatient Rx  Name  Route  Sig  Dispense  Refill  . Phenyleph-CPM-DM-APAP  (ALKA-SELTZER PLUS COLD & FLU PO)   Oral   Take 2 capsules by mouth every 8 (eight) hours as needed (COLD/FLU).          BP 147/89  Pulse 74  Temp(Src) 99.3 F (37.4 C) (Oral)  SpO2 93% Physical Exam  Nursing note and vitals reviewed. Constitutional: He is oriented to person, place, and time. He appears well-developed and well-nourished. No distress.  HENT:  Head: Normocephalic and atraumatic.  Eyes: Conjunctivae and EOM are normal.  Neck: Normal range of motion. Neck supple. No JVD present. No tracheal deviation present. No thyromegaly present.  Cardiovascular: Normal rate, regular rhythm and normal heart sounds.   Pulmonary/Chest: Effort normal and breath sounds normal. No respiratory distress. He has no wheezes.  Abdominal: Soft. Bowel sounds are normal. He exhibits no distension. There is no tenderness.  Musculoskeletal: Normal range of motion.  Lymphadenopathy:    He has no cervical adenopathy.  Neurological: He is alert and oriented to person, place, and time.  Skin: Skin is warm and dry.  Psychiatric: He has a normal mood and affect. His behavior is normal. Judgment and thought content normal.    ED Course  Procedures (including critical care time) Labs Review Labs Reviewed - No data to display Imaging Review No results found.  EKG Interpretation   None       MDM   1. Influenza    Cough, congestion, fever and body aches consistent with influenza. Other family members with similar symptoms. Negative chest x-ray. Reassuring exam. Plan discussed  with pt and he agrees. Return precautions given. Home with tessalon and fluticasone. Stable for discharge home.      Irish Elders, NP 05/01/13 0021

## 2013-04-30 NOTE — ED Notes (Signed)
Patient states he has been having body pain, watery eyes, H/A and cough. Patient states he has taken several OTC medications with no improvement. Patient states he is coughing up light green sputum.

## 2013-05-01 NOTE — ED Provider Notes (Signed)
Medical screening examination/treatment/procedure(s) were performed by non-physician practitioner and as supervising physician I was immediately available for consultation/collaboration.  EKG Interpretation   None        Hurman Horn, MD 05/01/13 1408

## 2013-07-04 ENCOUNTER — Emergency Department (HOSPITAL_COMMUNITY)
Admission: EM | Admit: 2013-07-04 | Discharge: 2013-07-04 | Disposition: A | Discharge status: Home / Self Care | Payer: BC Managed Care – PPO | Attending: Emergency Medicine | Admitting: Emergency Medicine

## 2013-07-04 ENCOUNTER — Encounter (HOSPITAL_COMMUNITY): Payer: Self-pay | Admitting: Emergency Medicine

## 2013-07-04 DIAGNOSIS — R638 Other symptoms and signs concerning food and fluid intake: Secondary | ICD-10-CM | POA: Insufficient documentation

## 2013-07-04 DIAGNOSIS — H9209 Otalgia, unspecified ear: Secondary | ICD-10-CM | POA: Insufficient documentation

## 2013-07-04 DIAGNOSIS — Z8719 Personal history of other diseases of the digestive system: Secondary | ICD-10-CM | POA: Insufficient documentation

## 2013-07-04 DIAGNOSIS — Z87891 Personal history of nicotine dependence: Secondary | ICD-10-CM | POA: Insufficient documentation

## 2013-07-04 DIAGNOSIS — R1110 Vomiting, unspecified: Secondary | ICD-10-CM | POA: Insufficient documentation

## 2013-07-04 DIAGNOSIS — J069 Acute upper respiratory infection, unspecified: Secondary | ICD-10-CM | POA: Insufficient documentation

## 2013-07-04 MED ORDER — HYDROCOD POLST-CHLORPHEN POLST 10-8 MG/5ML PO LQCR: 5.0000 mL | Freq: Two times a day (BID) | ORAL | Status: DC | PRN

## 2013-07-04 NOTE — ED Notes (Signed)
Pt states that he has had a cough, nasal congestion, and sore throat x 3 days.

## 2013-07-04 NOTE — ED Provider Notes (Signed)
CSN: [id number]     Arrival date & time 07/04/13  1416 History  This chart was scribed for Elpidio AnisShari Giavanna Kang, PA working with Toy BakerAnthony T Allen, MD by Quintella ReichertMatthew Underwood, ED Scribe. This patient was seen in room WTR9/WTR9 and the patient's care was started at 3:37 PM.   Chief Complaint  Patient presents with  . Nasal Congestion  . Sore Throat  . Cough    The history is provided by the patient. No language interpreter was used.    HPI Comments: Ethan Middleton is a 38 y.o. male who presents to the Emergency Department complaining of 3 days of persistent cough with associated nasal congestion, sore throat, generalized body aches, and ear fullness.  Pt states he has been waking up coughing at night.  His sore throat is described as "burning" and is worsened by swallowing and coughing.  He also reports that he had one episode of emesis last night.  He reports decreased appetite but is tolerating fluids.  He denies fever, diarrhea, rash, or abdominal pain.   Past Medical History  Diagnosis Date  . Celiac disease     Past Surgical History  Procedure Laterality Date  . Hernia repair      History reviewed. No pertinent family history.   History  Substance Use Topics  . Smoking status: Former Smoker -- 0.50 packs/day  . Smokeless tobacco: Not on file  . Alcohol Use: Yes     Comment: rare     Review of Systems  Constitutional: Positive for appetite change. Negative for fever.  HENT: Positive for congestion, ear pain (pressure) and sore throat.   Respiratory: Positive for cough.   Gastrointestinal: Positive for vomiting (one episode). Negative for abdominal pain and diarrhea.  Musculoskeletal: Positive for myalgias.  Skin: Negative for rash.  All other systems reviewed and are negative.      Allergies  Gluten meal  Home Medications   Current Outpatient Rx  Name  Route  Sig  Dispense  Refill  . OVER THE COUNTER MEDICATION      as directed. Cold and flu symptoms.           BP 134/80  Pulse 79  Temp(Src) 99.3 F (37.4 C) (Oral)  Resp 16  SpO2 98%  Physical Exam  Nursing note and vitals reviewed. Constitutional: He is oriented to person, place, and time. He appears well-developed and well-nourished. No distress.  HENT:  Head: Normocephalic and atraumatic.  Right Ear: Tympanic membrane normal.  Left Ear: Tympanic membrane normal.  Mouth/Throat: Uvula is midline. Posterior oropharyngeal erythema present. No oropharyngeal exudate or posterior oropharyngeal edema.  Eyes: EOM are normal.  Neck: Neck supple. No tracheal deviation present.  Cardiovascular: Normal rate, regular rhythm and normal heart sounds.   No murmur heard. Pulmonary/Chest: Effort normal and breath sounds normal. No respiratory distress. He has no wheezes. He has no rales.  Abdominal: Soft. There is no tenderness.  Musculoskeletal: Normal range of motion.  Lymphadenopathy:    He has cervical adenopathy.  Neurological: He is alert and oriented to person, place, and time.  Skin: Skin is warm and dry.  Psychiatric: He has a normal mood and affect. His behavior is normal.    ED Course  Procedures (including critical care time)  DIAGNOSTIC STUDIES: Oxygen Saturation is 98% on room air, normal by my interpretation.    COORDINATION OF CARE: 3:43 PM-Informed pt that symptoms are likely due to self-limiting viral infection.  Discussed treatment plan which includes symptomatic relief with pt  at bedside and pt agreed to plan.     Labs Review Labs Reviewed - No data to display  Imaging Review No results found.   EKG Interpretation None      MDM   Final diagnoses:  None    1. URI  Well appearing patient with likely viral process.     I personally performed the services described in this documentation, which was scribed in my presence. The recorded information has been reviewed and is accurate.     Arnoldo Hooker, PA-C 07/04/13 1712

## 2013-07-04 NOTE — Discharge Instructions (Signed)
Antibiotic Nonuse  Your caregiver felt that the infection or problem was not one that would be helped with an antibiotic. Infections may be caused by viruses or bacteria. Only a caregiver can tell which one of these is the likely cause of an illness. A cold is the most common cause of infection in both adults and children. A cold is a virus. Antibiotic treatment will have no effect on a viral infection. Viruses can lead to many lost days of work caring for sick children and many missed days of school. Children may catch as many as 10 "colds" or "flus" per year during which they can be tearful, cranky, and uncomfortable. The goal of treating a virus is aimed at keeping the ill person comfortable. Antibiotics are medications used to help the body fight bacterial infections. There are relatively few types of bacteria that cause infections but there are hundreds of viruses. While both viruses and bacteria cause infection they are very different types of germs. A viral infection will typically go away by itself within 7 to 10 days. Bacterial infections may spread or get worse without antibiotic treatment. Examples of bacterial infections are:  Sore throats (like strep throat or tonsillitis).  Infection in the lung (pneumonia).  Ear and skin infections. Examples of viral infections are:  Colds or flus.  Most coughs and bronchitis.  Sore throats not caused by Strep.  Runny noses. It is often best not to take an antibiotic when a viral infection is the cause of the problem. Antibiotics can kill off the helpful bacteria that we have inside our body and allow harmful bacteria to start growing. Antibiotics can cause side effects such as allergies, nausea, and diarrhea without helping to improve the symptoms of the viral infection. Additionally, repeated uses of antibiotics can cause bacteria inside of our body to become resistant. That resistance can be passed onto harmful bacterial. The next time you have  an infection it may be harder to treat if antibiotics are used when they are not needed. Not treating with antibiotics allows our own immune system to develop and take care of infections more efficiently. Also, antibiotics will work better for [place] when they are prescribed for bacterial infections. Treatments for a child that is ill may include:  Give extra fluids throughout the day to stay hydrated.  Get plenty of rest.  Only give your child over-the-counter or prescription medicines for pain, discomfort, or fever as directed by your caregiver.  The use of a cool mist humidifier may help stuffy noses.  Cold medications if suggested by your caregiver. Your caregiver may decide to start you on an antibiotic if:  The problem you were seen for today continues for a longer length of time than expected.  You develop a secondary bacterial infection. SEEK MEDICAL CARE IF:  Fever lasts longer than 5 days.  Symptoms continue to get worse after 5 to 7 days or become severe.  Difficulty in breathing develops.  Signs of dehydration develop (poor drinking, rare urinating, dark colored urine).  Changes in behavior or worsening tiredness (listlessness or lethargy). Document Released: 06/29/2001 Document Revised: 07/13/2011 Document Reviewed: 12/26/2008 [facility] Patient Information 2014 [place], [place].  Cough, Adult  A cough is a reflex that helps clear your throat and airways. It can help heal the body or may be a reaction to an irritated airway. A cough may only last 2 or 3 weeks (acute) or may last more than 8 weeks (chronic).  CAUSES Acute cough:  Viral or  bacterial infections. °Chronic cough: °· Infections. °· Allergies. °· Asthma. °· Post-nasal drip. °· Smoking. °· Heartburn or acid reflux. °· Some medicines. °· Chronic lung problems (COPD). °· Cancer. °SYMPTOMS  °· Cough. °· Fever. °· Chest pain. °· Increased breathing rate. °· High-pitched whistling sound when breathing  (wheezing). °· Colored mucus that you cough up (sputum). °TREATMENT  °· A bacterial cough may be treated with antibiotic medicine. °· A viral cough must run its course and will not respond to antibiotics. °· Your caregiver may recommend other treatments if you have a chronic cough. °HOME CARE INSTRUCTIONS  °· Only take over-the-counter or prescription medicines for pain, discomfort, or fever as directed by your caregiver. Use cough suppressants only as directed by your caregiver. °· Use a cold steam vaporizer or humidifier in your bedroom or home to help loosen secretions. °· Sleep in a semi-upright position if your cough is worse at night. °· Rest as needed. °· Stop smoking if you smoke. °SEEK IMMEDIATE MEDICAL CARE IF:  °· You have pus in your sputum. °· Your cough starts to worsen. °· You cannot control your cough with suppressants and are losing sleep. °· You begin coughing up blood. °· You have difficulty breathing. °· You develop pain which is getting worse or is uncontrolled with medicine. °· You have a fever. °MAKE SURE YOU:  °· Understand these instructions. °· Will watch your condition. °· Will get help right away if you are not doing well or get worse. °Document Released: 10/17/2010 Document Revised: 07/13/2011 Document Reviewed: 10/17/2010 °[employer]® Patient Information ©2014 [employer]. °Upper Respiratory Infection, Adult °An upper respiratory infection (URI) is also known as the common cold. It is often caused by a type of germ (virus). Colds are easily spread (contagious). You can pass it to others by kissing, coughing, sneezing, or drinking out of the same glass. Usually, you get better in 1 or 2 weeks.  °HOME CARE  °· Only take medicine as told by your doctor. °· Use a warm mist humidifier or breathe in steam from a hot shower. °· Drink enough water and fluids to keep your pee (urine) clear or pale yellow. °· Get plenty of rest. °· Return to work when your temperature is back to normal or as told  by your doctor. You may use a face mask and wash your hands to stop your cold from spreading. °GET HELP RIGHT AWAY IF:  °· After the first few days, you feel you are getting worse. °· You have questions about your medicine. °· You have chills, shortness of breath, or brown or red spit (mucus). °· You have yellow or brown snot (nasal discharge) or pain in the face, especially when you bend forward. °· You have a fever, puffy (swollen) neck, pain when you swallow, or white spots in the back of your throat. °· You have a bad headache, ear pain, sinus pain, or chest pain. °· You have a high-pitched whistling sound when you breathe in and out (wheezing). °· You have a lasting cough or cough up blood. °· You have sore muscles or a stiff neck. °MAKE SURE YOU:  °· Understand these instructions. °· Will watch your condition. °· Will get help right away if you are not doing well or get worse. °Document Released: 10/07/2007 Document Revised: 07/13/2011 Document Reviewed: 08/25/2010 °[employer]® Patient Information ©2014 [employer]. ° °

## 2013-07-08 NOTE — ED Provider Notes (Signed)
Medical screening examination/treatment/procedure(s) were performed by non-physician practitioner and as supervising physician I was immediately available for consultation/collaboration.   Toy BakerAnthony T Billi Bright, MD 07/08/13 [phone]

## 2013-12-08 ENCOUNTER — Emergency Department (HOSPITAL_COMMUNITY)
Admission: EM | Admit: 2013-12-08 | Discharge: 2013-12-08 | Disposition: A | Discharge status: Home / Self Care | Payer: BC Managed Care – PPO | Attending: Emergency Medicine | Admitting: Emergency Medicine

## 2013-12-08 ENCOUNTER — Encounter (HOSPITAL_COMMUNITY): Payer: Self-pay | Admitting: Emergency Medicine

## 2013-12-08 DIAGNOSIS — Z8719 Personal history of other diseases of the digestive system: Secondary | ICD-10-CM | POA: Insufficient documentation

## 2013-12-08 DIAGNOSIS — R5381 Other malaise: Secondary | ICD-10-CM | POA: Insufficient documentation

## 2013-12-08 DIAGNOSIS — M5412 Radiculopathy, cervical region: Secondary | ICD-10-CM | POA: Insufficient documentation

## 2013-12-08 DIAGNOSIS — R5383 Other fatigue: Secondary | ICD-10-CM

## 2013-12-08 DIAGNOSIS — R209 Unspecified disturbances of skin sensation: Secondary | ICD-10-CM | POA: Insufficient documentation

## 2013-12-08 DIAGNOSIS — Z87891 Personal history of nicotine dependence: Secondary | ICD-10-CM | POA: Insufficient documentation

## 2013-12-08 LAB — CBC WITH DIFFERENTIAL/PLATELET
Basophils Absolute: 0 10*3/uL (ref 0.0–0.1)
Basophils Relative: 1 % (ref 0–1)
Eosinophils Absolute: 0.2 10*3/uL (ref 0.0–0.7)
Eosinophils Relative: 3 % (ref 0–5)
HCT: 41 % (ref 39.0–52.0)
HEMOGLOBIN: 13.6 g/dL (ref 13.0–17.0)
LYMPHS ABS: 2.5 10*3/uL (ref 0.7–4.0)
Lymphocytes Relative: 38 % (ref 12–46)
MCH: 27.8 pg (ref 26.0–34.0)
MCHC: 33.2 g/dL (ref 30.0–36.0)
MCV: 83.7 fL (ref 78.0–100.0)
MONOS PCT: 9 % (ref 3–12)
Monocytes Absolute: 0.6 10*3/uL (ref 0.1–1.0)
Neutro Abs: 3.3 10*3/uL (ref 1.7–7.7)
Neutrophils Relative %: 49 % (ref 43–77)
Platelets: 275 10*3/uL (ref 150–400)
RBC: 4.9 MIL/uL (ref 4.22–5.81)
RDW: 13.9 % (ref 11.5–15.5)
WBC: 6.5 10*3/uL (ref 4.0–10.5)

## 2013-12-08 LAB — COMPREHENSIVE METABOLIC PANEL
ALK PHOS: 70 U/L (ref 39–117)
ALT: 22 U/L (ref 0–53)
ANION GAP: 13 (ref 5–15)
AST: 15 U/L (ref 0–37)
Albumin: 4 g/dL (ref 3.5–5.2)
BILIRUBIN TOTAL: 0.4 mg/dL (ref 0.3–1.2)
BUN: 12 mg/dL (ref 6–23)
CO2: 25 mEq/L (ref 19–32)
CREATININE: 0.74 mg/dL (ref 0.50–1.35)
Calcium: 9.4 mg/dL (ref 8.4–10.5)
Chloride: 100 mEq/L (ref 96–112)
GFR calc non Af Amer: >90 mL/min (ref >90)
GLUCOSE: 101 mg/dL — AB (ref 70–99)
Potassium: 4 mEq/L (ref 3.7–5.3)
Sodium: 138 mEq/L (ref 137–147)
TOTAL PROTEIN: 7.5 g/dL (ref 6.0–8.3)

## 2013-12-08 MED ORDER — PREDNISONE 20 MG PO TABS: 40.0000 mg | ORAL_TABLET | Freq: Every day | ORAL | Status: DC

## 2013-12-08 MED ORDER — PREDNISONE 20 MG PO TABS
60.0000 mg | ORAL_TABLET | Freq: Once | ORAL | Status: AC
  Administered 2013-12-08: 60 mg via ORAL
  Filled 2013-12-08: qty 3

## 2013-12-08 MED ORDER — SODIUM CHLORIDE 0.9 % IV BOLUS (SEPSIS)
1000.0000 mL | Freq: Once | INTRAVENOUS | Status: AC
  Administered 2013-12-08: 1000 mL via INTRAVENOUS

## 2013-12-08 NOTE — Discharge Instructions (Signed)
Cervical Radiculopathy Cervical radiculopathy happens when a nerve in the neck is pinched or bruised by a slipped (herniated) disk or by arthritic changes in the bones of the cervical spine. This can occur due to an injury or as part of the normal aging process. Pressure on the cervical nerves can cause pain or numbness that runs from your neck all the way down into your arm and fingers. CAUSES  There are many possible causes, including:  Injury.  Muscle tightness in the neck from overuse.  Swollen, painful joints (arthritis).  Breakdown or degeneration in the bones and joints of the spine (spondylosis) due to aging.  Bone spurs that may develop near the cervical nerves. SYMPTOMS  Symptoms include pain, weakness, or numbness in the affected arm and hand. Pain can be severe or irritating. Symptoms may be worse when extending or turning the neck. DIAGNOSIS  Your caregiver will ask about your symptoms and do a physical exam. He or she may test your strength and reflexes. X-rays, CT scans, and MRI scans may be needed in cases of injury or if the symptoms do not go away after a period of time. Electromyography (EMG) or nerve conduction testing may be done to study how your nerves and muscles are working. TREATMENT  Your caregiver may recommend certain exercises to help relieve your symptoms. Cervical radiculopathy can, and often does, get better with time and treatment. If your problems continue, treatment options may include:  Wearing a soft collar for short periods of time.  Physical therapy to strengthen the neck muscles.  Medicines, such as nonsteroidal anti-inflammatory drugs (NSAIDs), oral corticosteroids, or spinal injections.  Surgery. Different types of surgery may be done depending on the cause of your problems. HOME CARE INSTRUCTIONS   Put ice on the affected area.  Put ice in a plastic bag.  Place a towel between your skin and the bag.  Leave the ice on for 15-20 minutes,  03-04 times a day or as directed by your caregiver.  If ice does not help, you can try using heat. Take a warm shower or bath, or use a hot water bottle as directed by your caregiver.  You may try a gentle neck and shoulder massage.  Use a flat pillow when you sleep.  Only take over-the-counter or prescription medicines for pain, discomfort, or fever as directed by your caregiver.  If physical therapy was prescribed, follow your caregiver's directions.  If a soft collar was prescribed, use it as directed. SEEK IMMEDIATE MEDICAL CARE IF:   Your pain gets much worse and cannot be controlled with medicines.  You have weakness or numbness in your hand, arm, face, or leg.  You have a high fever or a stiff, rigid neck.  You lose bowel or bladder control (incontinence).  You have trouble with walking, balance, or speaking. MAKE SURE YOU:   Understand these instructions.  Will watch your condition.  Will get help right away if you are not doing well or get worse. Document Released: 01/13/2001 Document Revised: 07/13/2011 Document Reviewed: 12/02/2010 [facility] Patient Information 2015 [place]. This information is not intended to replace advice given to you by your health care provider. Make sure you discuss any questions you have with your health care provider. Thyroid-Stimulating Hormone A thyroid-stimulating hormone (TSH) test is a blood test that is done to measure the level of TSH, also known as thyrotropin, in your blood. Knowing the level of TSH in your blood can help your health care provider:  Diagnose a thyroid gland or pituitary gland disorder.  Manage your condition and treatment if you have hypothyroidism or hyperthyroidism. TSH is produced by the pituitary gland. The pituitary gland is a small organ located just below the brain, behind your eyes and nasal passages. It is part of a system that monitors and maintains thyroid hormone levels and thyroid gland function.  Thyroid hormones affect many body parts and systems, including the system that affects how quickly your body burns fuel for energy. Your health care provider may recommend testing your TSH level if you have signs and symptoms that are often associated with abnormal thyroid hormone levels. The blood used for testing is obtained through a needle placed in a vein in your arm. RESULTS It is your responsibility to obtain your test results. Ask the lab or department performing the test when and how you will get your results. Contact your health care provider to discuss any questions you have about your results.  Your health care provider will go over the test results with you and discuss the importance and meaning of your results, as well as the need for additional tests if necessary. Range of Normal Values  Ranges for normal values may vary among different labs and hospitals. You should always check with your health care provider after having lab work or other tests done to discuss whether your values are considered within normal limits.  Adult: 0.3-5 microU/mL or 0.3-5 mU/L (SI units).  Newborn: 3-18 microU/mL or 3-18 mU/L.  Cord: 3-12 microU/mL or 3-12 mU/L. Meaning of Results Outside Normal Value Ranges A high level of TSH may mean:  Your thyroid gland is not making enough thyroid hormones. When the thyroid gland does not make enough thyroid hormones, the pituitary gland releases TSH into the bloodstream. The higher-than-normal levels of TSH prompt the thyroid gland to release more thyroid hormones.  You are getting an insufficient level of thyroid hormone medicine, if you are receiving this type of treatment.  There is a problem with the pituitary gland (rare). A low level of TSH can indicate a problem with the pituitary gland. Document Released: 05/15/2004 Document Revised: 09/04/2013 Document Reviewed: 04/01/2008 [facility] Patient Information 2015 [place], [place]. This information is not  intended to replace advice given to you by your health care provider. Make sure you discuss any questions you have with your health care provider.

## 2013-12-08 NOTE — ED Notes (Signed)
PA at bedside.

## 2013-12-08 NOTE — ED Provider Notes (Signed)
CSN: [id number]     Arrival date & time 12/08/13  1819 History   First MD Initiated Contact with Patient 12/08/13 2007     Chief Complaint  Patient presents with  . Multiple complaints     dry mouth, fatigue     (Consider location/radiation/quality/duration/timing/severity/associated sxs/prior Treatment) HPI Comments: Patient presents to the emergency department with multiple complaints.  1: Generalized fatigue. Patient states that he's been feeling fatigued for the past couple of months. He states that he is very hectic work schedule. He states he lacks energy that he used to have. He denies any associated fevers, chills, nausea, vomiting, diarrhea, constipation. He has not taken anything to alleviate his symptoms. There are no aggravating or relieving factors.  2: Left hand numbness. Patient states that for the last month he has had some numbness and tingling in his left hand, specifically in the fourth and fifth fingers. He states that this improves with massage of his neck, as well as with head movement. He has not taken anything to alleviate his symptoms. There are no aggravating or relieving factors.  The history is provided by the patient. No language interpreter was used.    Past Medical History  Diagnosis Date  . Celiac disease    Past Surgical History  Procedure Laterality Date  . Hernia repair     History reviewed. No pertinent family history. History  Substance Use Topics  . Smoking status: Former Smoker -- 0.50 packs/day  . Smokeless tobacco: Not on file  . Alcohol Use: Yes     Comment: social    Review of Systems  All other systems reviewed and are negative.     Allergies  Gluten meal  Home Medications   Prior to Admission medications   Medication Sig Start Date End Date Taking? Authorizing Provider  predniSONE (DELTASONE) 20 MG tablet Take 2 tablets (40 mg total) by mouth daily. 12/08/13   Roxy Horseman, PA-C   BP 129/95  Pulse 67  Temp(Src) 98.5 F  (36.9 C) (Oral)  Resp 20  SpO2 98% Physical Exam  Nursing note and vitals reviewed. Constitutional: He is oriented to person, place, and time. He appears well-developed and well-nourished. No distress.  HENT:  Head: Normocephalic and atraumatic.  Moist mucous membranes  Eyes: Conjunctivae and EOM are normal. Pupils are equal, round, and reactive to light. Right eye exhibits no discharge. Left eye exhibits no discharge. No scleral icterus.  Neck: Normal range of motion. Neck supple. No JVD present.  Cardiovascular: Normal rate, regular rhythm, normal heart sounds and intact distal pulses.  Exam reveals no gallop and no friction rub.   No murmur heard. Intact distal pulses with brisk capillary refill  Pulmonary/Chest: Effort normal and breath sounds normal. No respiratory distress. He has no wheezes. He has no rales. He exhibits no tenderness.  Abdominal: Soft. He exhibits no distension and no mass. There is no tenderness. There is no rebound and no guarding.  Musculoskeletal: Normal range of motion. He exhibits no edema and no tenderness.  Moves all extremities   Neurological: He is alert and oriented to person, place, and time.  Sensation and strength intact.  Skin: Skin is warm and dry.  Psychiatric: He has a normal mood and affect. His behavior is normal. Judgment and thought content normal.    ED Course  Procedures (including critical care time) Results for orders placed during the hospital encounter of 12/08/13  CBC WITH DIFFERENTIAL      Result Value Ref Range  WBC 6.5  4.0 - 10.5 K/uL   RBC 4.90  4.22 - 5.81 MIL/uL   Hemoglobin 13.6  13.0 - 17.0 g/dL   HCT 65.741.0  84.639.0 - 96.252.0 %   MCV 83.7  78.0 - 100.0 fL   MCH 27.8  26.0 - 34.0 pg   MCHC 33.2  30.0 - 36.0 g/dL   RDW 95.213.9  84.111.5 - 32.415.5 %   Platelets 275  150 - 400 K/uL   Neutrophils Relative % 49  43 - 77 %   Neutro Abs 3.3  1.7 - 7.7 K/uL   Lymphocytes Relative 38  12 - 46 %   Lymphs Abs 2.5  0.7 - 4.0 K/uL   Monocytes  Relative 9  3 - 12 %   Monocytes Absolute 0.6  0.1 - 1.0 K/uL   Eosinophils Relative 3  0 - 5 %   Eosinophils Absolute 0.2  0.0 - 0.7 K/uL   Basophils Relative 1  0 - 1 %   Basophils Absolute 0.0  0.0 - 0.1 K/uL  COMPREHENSIVE METABOLIC PANEL      Result Value Ref Range   Sodium 138  137 - 147 mEq/L   Potassium 4.0  3.7 - 5.3 mEq/L   Chloride 100  96 - 112 mEq/L   CO2 25  19 - 32 mEq/L   Glucose, Bld 101 (*) 70 - 99 mg/dL   BUN 12  6 - 23 mg/dL   Creatinine, Ser 4.010.74  0.50 - 1.35 mg/dL   Calcium 9.4  8.4 - 02.710.5 mg/dL   Total Protein 7.5  6.0 - 8.3 g/dL   Albumin 4.0  3.5 - 5.2 g/dL   AST 15  0 - 37 U/L   ALT 22  0 - 53 U/L   Alkaline Phosphatase 70  39 - 117 U/L   Total Bilirubin 0.4  0.3 - 1.2 mg/dL   GFR calc non Af Amer >90  >90 mL/min   GFR calc Af Amer >90  >90 mL/min   Anion gap 13  5 - 15   No results found.   Imaging Review No results found.   EKG Interpretation None      MDM   Final diagnoses:  Other fatigue  Cervical radiculopathy    Patient with generalized fatigue. Has been ongoing for several months. Check labs, will reassess.  Labs are reassuring. I will have a TSH, and recommend followup with primary care. A lot of the patient's symptoms somewhat could be thyroid related.  Patient is stable and ready for discharge. He denies any chest pain.  PERC negative. No history of PE or DVT, no recent surgery, no hemoptysis, no exogenous estrogen use, no unilateral leg swelling.   Additionally, I suspect the patient has cervical radiculopathy, she has numbness and tingling in his fourth and fifth fingers. He does have normal range of motion and strength, and is neurovascularly intact.  Recommend primary care followup. Patient understands and agrees with the plan.    Roxy Horsemanobert Azell Bill, PA-C 12/08/13 2152

## 2013-12-08 NOTE — ED Notes (Signed)
Per pt, Multiple complaints.  For one month has had tingling in hands/arm.  Can beat his hand and blood flow returns and it gets better.  Pt c/o swelling above left eye. Pt states eyes are yellow for 3 days.  Mouth dry with metallic taste.  Shortness of breath for 2 days.  Pt state he travels a lot for work.  More driving lately b/c he doesn't like to fly.  Went to urgent care last week for a fatigue feeling and dry mouth/nausea.  Pt given phenergan.  Pt feels glands are swollen.

## 2013-12-09 LAB — TSH: TSH: 2.23 u[IU]/mL (ref 0.350–4.500)

## 2013-12-09 NOTE — ED Provider Notes (Signed)
Medical screening examination/treatment/procedure(s) were performed by non-physician practitioner and as supervising physician I was immediately available for consultation/collaboration.   EKG Interpretation None      TSH was wnl.   Purvis SheffieldForrest Neesa Knapik, MD 12/09/13 [phone]

## 2013-12-21 ENCOUNTER — Emergency Department (HOSPITAL_COMMUNITY)
Admission: EM | Admit: 2013-12-21 | Discharge: 2013-12-21 | Disposition: A | Discharge status: Home / Self Care | Payer: BC Managed Care – PPO | Attending: Emergency Medicine | Admitting: Emergency Medicine

## 2013-12-21 ENCOUNTER — Ambulatory Visit (HOSPITAL_BASED_OUTPATIENT_CLINIC_OR_DEPARTMENT_OTHER): Payer: BC Managed Care – PPO | Admitting: Family Medicine

## 2013-12-21 ENCOUNTER — Emergency Department (HOSPITAL_COMMUNITY): Payer: BC Managed Care – PPO

## 2013-12-21 ENCOUNTER — Encounter: Payer: Self-pay | Admitting: Family Medicine

## 2013-12-21 ENCOUNTER — Encounter (HOSPITAL_COMMUNITY): Payer: Self-pay | Admitting: Emergency Medicine

## 2013-12-21 VITALS — BP 119/81 | HR 74 | Temp 98.4°F | Resp 16 | Ht 67.0 in | Wt 197.0 lb

## 2013-12-21 DIAGNOSIS — R5383 Other fatigue: Secondary | ICD-10-CM | POA: Insufficient documentation

## 2013-12-21 DIAGNOSIS — Z113 Encounter for screening for infections with a predominantly sexual mode of transmission: Secondary | ICD-10-CM

## 2013-12-21 DIAGNOSIS — R29818 Other symptoms and signs involving the nervous system: Secondary | ICD-10-CM

## 2013-12-21 DIAGNOSIS — G8929 Other chronic pain: Secondary | ICD-10-CM | POA: Diagnosis not present

## 2013-12-21 DIAGNOSIS — R5382 Chronic fatigue, unspecified: Secondary | ICD-10-CM

## 2013-12-21 DIAGNOSIS — G5690 Unspecified mononeuropathy of unspecified upper limb: Secondary | ICD-10-CM | POA: Diagnosis not present

## 2013-12-21 DIAGNOSIS — R5381 Other malaise: Secondary | ICD-10-CM | POA: Insufficient documentation

## 2013-12-21 DIAGNOSIS — G5692 Unspecified mononeuropathy of left upper limb: Secondary | ICD-10-CM

## 2013-12-21 DIAGNOSIS — Z792 Long term (current) use of antibiotics: Secondary | ICD-10-CM | POA: Diagnosis not present

## 2013-12-21 DIAGNOSIS — L03317 Cellulitis of buttock: Secondary | ICD-10-CM

## 2013-12-21 DIAGNOSIS — L0231 Cutaneous abscess of buttock: Secondary | ICD-10-CM

## 2013-12-21 DIAGNOSIS — G4733 Obstructive sleep apnea (adult) (pediatric): Secondary | ICD-10-CM | POA: Insufficient documentation

## 2013-12-21 DIAGNOSIS — Z87891 Personal history of nicotine dependence: Secondary | ICD-10-CM | POA: Insufficient documentation

## 2013-12-21 DIAGNOSIS — H5789 Other specified disorders of eye and adnexa: Secondary | ICD-10-CM | POA: Diagnosis not present

## 2013-12-21 DIAGNOSIS — R42 Dizziness and giddiness: Secondary | ICD-10-CM | POA: Diagnosis not present

## 2013-12-21 DIAGNOSIS — Z8719 Personal history of other diseases of the digestive system: Secondary | ICD-10-CM | POA: Diagnosis not present

## 2013-12-21 DIAGNOSIS — K900 Celiac disease: Secondary | ICD-10-CM

## 2013-12-21 DIAGNOSIS — E559 Vitamin D deficiency, unspecified: Secondary | ICD-10-CM

## 2013-12-21 DIAGNOSIS — F529 Unspecified sexual dysfunction not due to a substance or known physiological condition: Secondary | ICD-10-CM

## 2013-12-21 DIAGNOSIS — G4730 Sleep apnea, unspecified: Secondary | ICD-10-CM

## 2013-12-21 DIAGNOSIS — Z0000 Encounter for general adult medical examination without abnormal findings: Secondary | ICD-10-CM | POA: Insufficient documentation

## 2013-12-21 LAB — I-STAT CHEM 8, ED
BUN: 11 mg/dL (ref 6–23)
CALCIUM ION: 1.2 mmol/L (ref 1.12–1.23)
CHLORIDE: 101 meq/L (ref 96–112)
Creatinine, Ser: 0.9 mg/dL (ref 0.50–1.35)
GLUCOSE: 96 mg/dL (ref 70–99)
HCT: 45 % (ref 39.0–52.0)
Hemoglobin: 15.3 g/dL (ref 13.0–17.0)
Potassium: 3.9 mEq/L (ref 3.7–5.3)
Sodium: 141 mEq/L (ref 137–147)
TCO2: 27 mmol/L (ref 0–100)

## 2013-12-21 LAB — POCT GLYCOSYLATED HEMOGLOBIN (HGB A1C): Hemoglobin A1C: 5.4

## 2013-12-21 MED ORDER — TADALAFIL 10 MG PO TABS: 10.0000 mg | ORAL_TABLET | Freq: Every day | ORAL | Status: DC | PRN

## 2013-12-21 NOTE — ED Notes (Signed)
Pt states for 5 plus weeks he's had L arm weakness, L side facial numbness, L side headache, L eye swelling and watery, has been seen multiple times for these symptoms w/ no abnormal findings. Pt a/o x 4.

## 2013-12-21 NOTE — Assessment & Plan Note (Signed)
A: based on history. P: Sleep study ordered  Avoid PM sedatives

## 2013-12-21 NOTE — Assessment & Plan Note (Signed)
A: resolved.  P: patient advised to complete keflex.

## 2013-12-21 NOTE — ED Provider Notes (Signed)
CSN: [id number]     Arrival date & time 12/21/13  1523 History   First MD Initiated Contact with Patient 12/21/13 1538     Chief Complaint  Patient presents with  . Weakness  . Headache     (Consider location/radiation/quality/duration/timing/severity/associated sxs/prior Treatment) The history is provided by the patient, medical records and the spouse. No language interpreter was used.    Ethan Middleton is a 38 y.o. male  with a hx of celiac disease presents to the Emergency Department complaining of gradual, persistent, progressively worsening generalized fatigue onset 5 weeks ago. Associated symptoms include feeling foggy headed, left arm paresthesias.  He reports his headache began 1 hour ago and is associated with left sided paresthesias of the face and scalp.  Pt is anxious and reports that he is significantly concerned that there is something wrong with his head.  Nothing makes it better and nothing makes it worse.  He did not attempt any OTC treatments for this PTA.  Pt denies fever, chills, neck pain, neck stiffness, CP, SOB, abd pain, N/V/D, weakness, dizziness, syncope.  [facility] but had an episode of "not being there" while in the grocery store about 1 hour ago with the onset of these symptoms prompting his visit.  Pt explicitly denies syncope.  Patient reports the headache is throbbing in nature, rated at a 3/10.  He denies thunderclap headache, worse headache of his life, photophobia, vision changes, nausea and vomiting.  He denies personal or family history of known aneurysm, MS or other autoimmune disease.    Past Medical History  Diagnosis Date  . Celiac disease    Past Surgical History  Procedure Laterality Date  . Hernia repair     Family History  Problem Relation Age of Onset  . Arthritis Maternal Uncle   . Asthma Maternal Uncle   . Diabetes Maternal Grandmother   . Asthma Maternal Grandmother   . Arthritis Maternal  Grandmother    History  Substance Use Topics  . Smoking status: Former Smoker -- 0.50 packs/day    Quit date: 08/02/2013  . Smokeless tobacco: Never Used  . Alcohol Use: 3.6 oz/week    6 Shots of liquor per week     Comment: social    Review of Systems  Constitutional: Positive for fatigue. Negative for fever, diaphoresis, appetite change and unexpected weight change.  HENT: Negative for mouth sores.   Eyes: Negative for visual disturbance.  Respiratory: Negative for cough, chest tightness, shortness of breath and wheezing.   Cardiovascular: Negative for chest pain.  Gastrointestinal: Negative for nausea, vomiting, abdominal pain, diarrhea and constipation.  Endocrine: Negative for polydipsia, polyphagia and polyuria.  Genitourinary: Negative for dysuria, urgency, frequency and hematuria.  Musculoskeletal: Negative for back pain and neck stiffness.  Skin: Negative for rash.  Allergic/Immunologic: Negative for immunocompromised state.  Neurological: Positive for weakness (LUE for >5 weeks), numbness ( radial distribution of the LUE) and headaches. Negative for syncope and light-headedness.  Hematological: Does not bruise/bleed easily.  Psychiatric/Behavioral: Negative for sleep disturbance. The patient is not nervous/anxious.       Allergies  Gluten meal  Home Medications   Prior to Admission medications   Medication Sig Start Date End Date Taking? Authorizing Provider  cephALEXin (KEFLEX) 250 MG capsule Take 500 mg by mouth 4 (four) times daily.    Yes Historical Provider, MD  HYDROcodone-acetaminophen (NORCO/VICODIN) 5-325 MG per tablet Take 1 tablet by mouth every 6 (six) hours as  needed for moderate pain.   Yes Historical Provider, MD  Ibuprofen-Diphenhydramine HCl (ADVIL PM) 200-25 MG CAPS Take 2 capsules by mouth at bedtime.   Yes Historical Provider, MD  tadalafil (CIALIS) 10 MG tablet Take 1 tablet (10 mg total) by mouth daily as needed for erectile dysfunction.  12/21/13  Yes Josalyn C Funches, MD   BP 136/92  Pulse 72  Temp(Src) 98.3 F (36.8 C) (Oral)  Resp 18  SpO2 100% Physical Exam  Nursing note and vitals reviewed. Constitutional: He is oriented to person, place, and time. He appears well-developed and well-nourished. No distress.  Awake, alert, nontoxic appearance  HENT:  Head: Normocephalic and atraumatic.  Mouth/Throat: Oropharynx is clear and moist. No oropharyngeal exudate.  Eyes: Conjunctivae and EOM are normal. Pupils are equal, round, and reactive to light. No scleral icterus.  No horizontal, vertical or rotational nystagmus  Neck: Normal range of motion. Neck supple.  Full active and passive ROM without pain No midline or paraspinal tenderness No nuchal rigidity or meningeal signs  Cardiovascular: Normal rate, regular rhythm, normal heart sounds and intact distal pulses.   No murmur heard. Pulmonary/Chest: Effort normal and breath sounds normal. No respiratory distress. He has no wheezes. He has no rales.  Equal chest expansion  Abdominal: Soft. Bowel sounds are normal. He exhibits no mass. There is no tenderness. There is no rebound and no guarding.  Musculoskeletal: Normal range of motion. He exhibits no edema.  Lymphadenopathy:    He has no cervical adenopathy.  Neurological: He is alert and oriented to person, place, and time. He has normal reflexes. No cranial nerve deficit. He exhibits normal muscle tone. Coordination normal.  Mental Status:  Alert, oriented, thought content appropriate. Speech fluent without evidence of aphasia. Able to follow 2 step commands without difficulty.  Cranial Nerves:  II:  Peripheral visual fields grossly normal, pupils equal, round, reactive to light III,IV, VI: ptosis not present, extra-ocular motions intact bilaterally  V,VII: smile symmetric, facial light touch sensation equal VIII: hearing grossly normal bilaterally  IX,X: gag reflex present  XI: bilateral shoulder shrug equal and  strong XII: midline tongue extension  Motor:  5/5 in right upper and bilateral lower extremities bilaterally including strong grip strength on the right and bilateral dorsiflexion/plantar flexion 4/5 in left upper extremity including grip strength  Sensory: Pinprick and light touch normal in all extremities.  Deep Tendon Reflexes: 2+ and symmetric  Cerebellar: normal finger-to-nose with bilateral upper extremities Gait: normal gait and balance CV: distal pulses palpable throughout   Skin: Skin is warm and dry. No rash noted. He is not diaphoretic.  Psychiatric: He has a normal mood and affect. His behavior is normal. Judgment and thought content normal.    ED Course  Procedures (including critical care time) Labs Review Labs Reviewed  I-STAT CHEM 8, ED    Imaging Review Ct Head Wo Contrast  12/21/2013   CLINICAL DATA:  Headache.  EXAM: CT HEAD WITHOUT CONTRAST  TECHNIQUE: Contiguous axial images were obtained from the base of the skull through the vertex without intravenous contrast.  COMPARISON:  MRI 12/21/2013.  FINDINGS: No intra-axial or extra-axial pathologic fluid or blood collection. No mass. No hydrocephalus. No acute bony abnormality. Calvarium is intact. Visualized paranasal sinuses and mastoids are clear.  IMPRESSION: Negative exam.   Electronically Signed   By: Maisie Fus  Register   On: 12/21/2013 17:10   Mr Brain Wo Contrast  12/21/2013   CLINICAL DATA:  Left facial paresthesias. Headaches. Symptoms for  1 month.  EXAM: MRI HEAD WITHOUT CONTRAST  TECHNIQUE: Multiplanar, multiecho pulse sequences of the brain and surrounding structures were obtained without intravenous contrast.  COMPARISON:  Head CT 08/28/2006  FINDINGS: Images are mildly degraded by motion artifact despite repeating some sequences.  There is no evidence of acute infarct, intracranial hemorrhage, mass, midline shift, or extra-axial fluid collection. Ventricles and sulci are within normal limits for age. No definite  brain parenchymal signal abnormality is identified, with evaluation mildly limited by motion.  Orbits are unremarkable. Mild mucosal thickening is noted in the ethmoid air cells bilaterally. Small left maxillary sinus mucous retention cyst is noted. Mastoid air cells are clear. Major intracranial vascular flow voids are preserved.  IMPRESSION: No acute intracranial abnormality or mass identified. Unremarkable appearance of the brain allowing for motion artifact.   Electronically Signed   By: Sebastian Ache   On: 12/21/2013 16:58     EKG Interpretation None      MDM   Final diagnoses:  Neuropathy of left hand  Chronic fatigue   Ethan Middleton presents with greater than 5 weeks of left arm paresthesias with fatigue and intermittent left eye swelling. Patient has been evaluated several times in the emergency department for this. He returned to the emergency department today after being evaluated by his primary care physician when he developed a headache and paresthesias to the left side of the head.  History not consistent with subarachnoid hemorrhage.  Patient has had multiple workups with normal electrolytes. He does not have diabetes.  His left arm paresthesias are consistent with cervical radiculopathy. He was diagnosed with sleep apnea this morning by his primary care physician and sent for sleep study in 2 weeks.  His labs are without evidence of hyper/hypothyroidism.   On exam today pt with mild weakness in the left arm.  Highly doubt CVA, but will image.  DDx also includes anxiety, MS or other demyelination.  6:04 PM Head CT, MRI within normal limits. No evidence of mass, CVA, brain tumor or other intracranial abnormality. Electrolytes are within normal limits and patient without anemia.  Patient reports headache has resolved spontaneously along with her seizures of the left side of the face.   Question possible anxiety component to patient's symptoms today. Patient to be discharged home  with close followup at the sleep Center and primary care physician.   I have personally reviewed patient's vitals, nursing note and any pertinent labs or imaging.  I performed an undressed physical exam.    At this time, it has been determined that no acute conditions requiring further emergency intervention. The patient/guardian have been advised of the diagnosis and plan. I reviewed all labs and imaging including any potential incidental findings. We have discussed signs and symptoms that warrant return to the ED, such as increasing symptoms, weakness in any extremity.  Patient/guardian has voiced understanding and agreed to follow-up with the PCP or specialist in 2 weeks as scheduled.  .  Vital signs are stable at discharge.   BP 136/92  Pulse 72  Temp(Src) 98.3 F (36.8 C) (Oral)  Resp 18  SpO2 100%        Dierdre Forth, PA-C 12/21/13 2019

## 2013-12-21 NOTE — Patient Instructions (Signed)
[name],  Thank you for coming in today. He was very nice to meet you.   Our discussion makes me highly suspicious for sleep apnea. For this reason I placed an order for sleep study to be performed [facility].  I'll be in touch with lab results.  Please cut down on alcohol intake Limited to 3 drinks per sitting. Excessive alcohol intake and inferior with sleep as well as sexual performance.   Plan to see me in followup after you've had a sleep study completed.  Dr. Armen PickupFunches  Sleep Apnea  Sleep apnea is a sleep disorder characterized by abnormal pauses in breathing while you sleep. When your breathing pauses, the level of oxygen in your blood decreases. This causes you to move out of deep sleep and into light sleep. As a result, your quality of sleep is poor, and the system that carries your blood throughout your body (cardiovascular system) experiences stress. If sleep apnea remains untreated, the following conditions can develop:  High blood pressure (hypertension).  Coronary artery disease.  Inability to achieve or maintain an erection (impotence).  Impairment of your thought process (cognitive dysfunction). There are three types of sleep apnea: 1. Obstructive sleep apnea--Pauses in breathing during sleep because of a blocked airway. 2. Central sleep apnea--Pauses in breathing during sleep because the area of the brain that controls your breathing does not send the correct signals to the muscles that control breathing. 3. Mixed sleep apnea--A combination of both obstructive and central sleep apnea. RISK FACTORS The following risk factors can increase your risk of developing sleep apnea:  Being overweight.  Smoking.  Having narrow passages in your nose and throat.  Being of older age.  Being male.  Alcohol use.  Sedative and tranquilizer use.  Ethnicity. Among individuals younger than 35 years, African Americans are at increased risk of sleep  apnea. SYMPTOMS   Difficulty staying asleep.  Daytime sleepiness and fatigue.  Loss of energy.  Irritability.  Loud, heavy snoring.  Morning headaches.  Trouble concentrating.  Forgetfulness.  Decreased interest in sex. DIAGNOSIS  In order to diagnose sleep apnea, your caregiver will perform a physical examination. Your caregiver may suggest that you take a home sleep test. Your caregiver may also recommend that you spend the night in a sleep lab. In the sleep lab, several monitors record information about your heart, lungs, and brain while you sleep. Your leg and arm movements and blood oxygen level are also recorded. TREATMENT The following actions may help to resolve mild sleep apnea:  Sleeping on your side.   Using a decongestant if you have nasal congestion.   Avoiding the use of depressants, including alcohol, sedatives, and narcotics.   Losing weight and modifying your diet if you are overweight. There also are devices and treatments to help open your airway:  Oral appliances. These are custom-made mouthpieces that shift your lower jaw forward and slightly open your bite. This opens your airway.  Devices that create positive airway pressure. This positive pressure "splints" your airway open to help you breathe better during sleep. The following devices create positive airway pressure:  Continuous positive airway pressure (CPAP) device. The CPAP device creates a continuous level of air pressure with an air pump. The air is delivered to your airway through a mask while you sleep. This continuous pressure keeps your airway open.  Nasal expiratory positive airway pressure (EPAP) device. The EPAP device creates positive air pressure as you exhale. The device consists of  single-use valves, which are inserted into each nostril and held in place by adhesive. The valves create very little resistance when you inhale but create much more resistance when you exhale. That  increased resistance creates the positive airway pressure. This positive pressure while you exhale keeps your airway open, making it easier to breath when you inhale again.  Bilevel positive airway pressure (BPAP) device. The BPAP device is used mainly in patients with central sleep apnea. This device is similar to the CPAP device because it also uses an air pump to deliver continuous air pressure through a mask. However, with the BPAP machine, the pressure is set at two different levels. The pressure when you exhale is lower than the pressure when you inhale.  Surgery. Typically, surgery is only done if you cannot comply with less invasive treatments or if the less invasive treatments do not improve your condition. Surgery involves removing excess tissue in your airway to create a wider passage way. Document Released: 04/10/2002 Document Revised: 08/15/2012 Document Reviewed: 08/27/2011 [facility] Patient Information 2015 [place], [place]. This information is not intended to replace advice given to you by your health care provider. Make sure you discuss any questions you have with your health care provider.

## 2013-12-21 NOTE — Progress Notes (Signed)
Pt is here to establish care. Pt states that he has no energy and feeling very tired. Pt reports that his left hand and finger tingle and feels numb. Pt said that he does not sleep.

## 2013-12-21 NOTE — ED Notes (Signed)
Patient transported to MRI 

## 2013-12-21 NOTE — Discharge Instructions (Signed)
1. Medications: usual home medications 2. Treatment: rest, drink plenty of fluids,  3. Follow Up: Please followup with your primary doctor for discussion of your diagnoses and further evaluation after today's visit;  Fatigue Fatigue is a feeling of tiredness, lack of energy, lack of motivation, or feeling tired all the time. Having enough rest, good nutrition, and reducing stress will normally reduce fatigue. Consult your caregiver if it persists. The nature of your fatigue will help your caregiver to find out its cause. The treatment is based on the cause.  CAUSES  There are many causes for fatigue. Most of the time, fatigue can be traced to one or more of your habits or routines. Most causes fit into one or more of three general areas. They are: Lifestyle problems  Sleep disturbances.  Overwork.  Physical exertion.  Unhealthy habits.  Poor eating habits or eating disorders.  Alcohol and/or drug use .  Lack of proper nutrition (malnutrition). Psychological problems  Stress and/or anxiety problems.  Depression.  Grief.  Boredom. Medical Problems or Conditions  Anemia.  Pregnancy.  Thyroid gland problems.  Recovery from major surgery.  Continuous pain.  Emphysema or asthma that is not well controlled  Allergic conditions.  Diabetes.  Infections (such as mononucleosis).  Obesity.  Sleep disorders, such as sleep apnea.  Heart failure or other heart-related problems.  Cancer.  Kidney disease.  Liver disease.  Effects of certain medicines such as antihistamines, cough and cold remedies, prescription pain medicines, heart and blood pressure medicines, drugs used for treatment of cancer, and some antidepressants. SYMPTOMS  The symptoms of fatigue include:   Lack of energy.  Lack of drive (motivation).  Drowsiness.  Feeling of indifference to the surroundings. DIAGNOSIS  The details of how you feel help guide your caregiver in finding out what is  causing the fatigue. You will be asked about your present and past health condition. It is important to review all medicines that you take, including prescription and non-prescription items. A thorough exam will be done. You will be questioned about your feelings, habits, and normal lifestyle. Your caregiver may suggest blood tests, urine tests, or other tests to look for common medical causes of fatigue.  TREATMENT  Fatigue is treated by correcting the underlying cause. For example, if you have continuous pain or depression, treating these causes will improve how you feel. Similarly, adjusting the dose of certain medicines will help in reducing fatigue.  HOME CARE INSTRUCTIONS   Try to get the required amount of good sleep every night.  Eat a healthy and nutritious diet, and drink enough water throughout the day.  Practice ways of relaxing (including yoga or meditation).  Exercise regularly.  Make plans to change situations that cause stress. Act on those plans so that stresses decrease over time. Keep your work and personal routine reasonable.  Avoid street drugs and minimize use of alcohol.  Start taking a daily multivitamin after consulting your caregiver. SEEK MEDICAL CARE IF:   You have persistent tiredness, which cannot be accounted for.  You have fever.  You have unintentional weight loss.  You have headaches.  You have disturbed sleep throughout the night.  You are feeling sad.  You have constipation.  You have dry skin.  You have gained weight.  You are taking any new or different medicines that you suspect are causing fatigue.  You are unable to sleep at night.  You develop any unusual swelling of your legs or other parts of your body. SEEK  IMMEDIATE MEDICAL CARE IF:   You are feeling confused.  Your vision is blurred.  You feel faint or pass out.  You develop severe headache.  You develop severe abdominal, pelvic, or back pain.  You develop chest  pain, shortness of breath, or an irregular or fast heartbeat.  You are unable to pass a normal amount of urine.  You develop abnormal bleeding such as bleeding from the rectum or you vomit blood.  You have thoughts about harming yourself or committing suicide.  You are worried that you might harm someone else. MAKE SURE YOU:   Understand these instructions.  Will watch your condition.  Will get help right away if you are not doing well or get worse. Document Released: 02/15/2007 Document Revised: 07/13/2011 Document Reviewed: 08/22/2013 [facility] Patient Information 2015 [place], [place]. This information is not intended to replace advice given to you by your health care provider. Make sure you discuss any questions you have with your health care provider.

## 2013-12-21 NOTE — ED Notes (Signed)
Patient transported to CT 

## 2013-12-21 NOTE — Assessment & Plan Note (Signed)
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  P:  Treat lack of sleep/sleep apnea. Consider gabapentin if symptoms persist despite improved sleep.

## 2013-12-21 NOTE — Assessment & Plan Note (Signed)
A: related to poor sleep and ETOH. P: Decrease ETOH Sleep study, CPAP as needed. Improve sleep hygiene cialis prn, cautioned patient about mixing cialsis with nitrates

## 2013-12-21 NOTE — Progress Notes (Signed)
   Subjective:    Patient ID: Ethan Middleton, male    DOB: [birth year], 38 y.o.   MRN: [id number] CC: establish care, fatigue, left hand and arm tingling. HPI  38 year old male presents to establish care:  1. Fatigue: Patient reports fatigue x1 year. 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 He is a traveling to 3 cities in one week. He sleeps anywhere from 4-5 hours per night. He sleeps virtually interrupted to work in [employer]. Indocid daytime somnolence, snoring, episodes where he stops breathing at night recorded by his wife, decreased interest in sex. He denies depressed mood. He does endorse drinking 4-5 alcoholic drinks nightly in a social setting while working. He has been evaluated ED for this problem and that I was and to date has been negative.   2. left hand tingling: Patient with left hand tingling this started the fourth and fifth digit and not radiating proximally up his palm and arm. He denies injury. He has subjective weakness in his hand. This is an ongoing for the past 2 months. He feels that the symptoms are progressive. Additionally he does have twitching in both eyes. He denies weakness or tingling in his right extremity or his lower extremities. Patient is right-handed.  3. gluteal abscess: Patient was treated ED for gluteal abscess he is taking his Keflex is not require when necessary oxycodone. He denies fever chills worsening pain, and drainage.   social history: Patient is a former smoker he quit in 08/21/2013. Review of Systems  as per history of present illness     Objective:   Physical Exam BP 119/81  Pulse 74  Temp(Src) 98.4 F (36.9 C) (Oral)  Resp 16  Ht 5\' 7"  (1.702 m)  Wt 197 lb (89.359 kg)  BMI 30.85 kg/m2  SpO2 91% General appearance: alert, cooperative and no distress Eyes: conjunctivae/corneas clear. PERRL, EOM's intact.  Ears: normal TM's and external ear canals both ears Nose: no discharge, turbinates  pink, swollen Throat: lips, mucosa, and tongue normal; teeth and gums normal Neck: no adenopathy, supple, symmetrical, trachea midline and thyroid not enlarged, symmetric, no tenderness/mass/nodules Lungs: clear to auscultation bilaterally Heart: regular rate and rhythm, S1, S2 normal, no murmur, click, rub or gallop Gluteal: skin normal, no warmth, tenderness or fluctuance. Mild skin peeling R gluteal medially.  Extremities: no LE edema  Neuro:  Lid twitching both eyes  5/5 strength and tone b/l UE 2+ biceps and triceps reflexes both arms.         Assessment & Plan:

## 2013-12-21 NOTE — Assessment & Plan Note (Signed)
A: normal labs including TSH, Hgb, HgbA1c. Suspect sleep apnea. See above. P:  Evaluate and treat sleep apnea. Decrease ETOH intake. Focus on improving sleep hygiene

## 2013-12-22 ENCOUNTER — Ambulatory Visit: Payer: BC Managed Care – PPO | Admitting: Internal Medicine

## 2013-12-22 DIAGNOSIS — E559 Vitamin D deficiency, unspecified: Secondary | ICD-10-CM | POA: Insufficient documentation

## 2013-12-22 DIAGNOSIS — K900 Celiac disease: Secondary | ICD-10-CM | POA: Insufficient documentation

## 2013-12-22 LAB — VITAMIN D 25 HYDROXY (VIT D DEFICIENCY, FRACTURES): VIT D 25 HYDROXY: 25 ng/mL — AB (ref 30–89)

## 2013-12-22 LAB — HIV ANTIBODY (ROUTINE TESTING W REFLEX): HIV 1&2 Ab, 4th Generation: NONREACTIVE

## 2013-12-22 MED ORDER — CALCIUM CARB-CHOLECALCIFEROL 600-800 MG-UNIT PO TABS: 1.0000 | ORAL_TABLET | Freq: Every day | ORAL | Status: DC

## 2013-12-22 NOTE — Assessment & Plan Note (Signed)
A: insufficiency. Patient has hx of  Celiac disease suspect malabsorption. P: Cal 600 mg/Vitamin D 800 U daily supplement.

## 2013-12-22 NOTE — Addendum Note (Signed)
Addended by: Dessa PhiFUNCHES, Sanaa Zilberman on: 12/22/2013 09:29 AM   Modules accepted: Orders

## 2013-12-25 NOTE — ED Provider Notes (Signed)
Medical screening examination/treatment/procedure(s) were performed by non-physician practitioner and as supervising physician I was immediately available for consultation/collaboration.   EKG Interpretation   Date/Time:  Thursday December 21 2013 15:38:41 EDT Ventricular Rate:  75 PR Interval:  182 QRS Duration: 104 QT Interval:  384 QTC Calculation: 429 R Axis:   41 Text Interpretation:  Sinus rhythm Early repolarization No significant  change since last tracing Reconfirmed by Roselene Gray  MD-J, Nekayla Heider ([id number]) on  12/25/2013 7:26:33 AM         Linwood Dibbles, MD 12/25/13 [phone]

## 2013-12-29 ENCOUNTER — Telehealth: Payer: Self-pay | Admitting: *Deleted

## 2013-12-29 NOTE — Telephone Encounter (Signed)
Message copied by Raynelle Chary on Fri Dec 29, 2013 10:02 AM ------      Message from: Dessa Phi      Created: Fri Dec 22, 2013  9:31 AM       Normal screening A1c (no diabetes). Negative screening HIV.      Vitamin D insufficiency. Please start supplement sent to clinic. ------

## 2013-12-29 NOTE — Telephone Encounter (Signed)
Every phone number provided doesn't work.

## 2013-12-31 ENCOUNTER — Ambulatory Visit (HOSPITAL_BASED_OUTPATIENT_CLINIC_OR_DEPARTMENT_OTHER): Payer: BC Managed Care – PPO | Attending: Family Medicine

## 2013-12-31 VITALS — Ht 67.0 in | Wt 190.0 lb

## 2013-12-31 DIAGNOSIS — G4700 Insomnia, unspecified: Secondary | ICD-10-CM | POA: Diagnosis not present

## 2013-12-31 DIAGNOSIS — G4733 Obstructive sleep apnea (adult) (pediatric): Secondary | ICD-10-CM | POA: Diagnosis not present

## 2013-12-31 DIAGNOSIS — R29818 Other symptoms and signs involving the nervous system: Secondary | ICD-10-CM

## 2014-01-06 DIAGNOSIS — G4733 Obstructive sleep apnea (adult) (pediatric): Secondary | ICD-10-CM

## 2014-01-06 NOTE — Sleep Study (Signed)
   NAME: Ethan Middleton DATE OF BIRTH:  1975/12/16 MEDICAL RECORD NUMBER [id number]  LOCATION: [facility]  PHYSICIAN: YOUNG,CLINTON D  DATE OF STUDY: 12/31/2013  SLEEP STUDY TYPE: Nocturnal Polysomnogram               REFERRING PHYSICIAN: Funches, Josalyn C, MD  INDICATION FOR STUDY: Insomnia with sleep apnea  EPWORTH SLEEPINESS SCORE:   12/24 HEIGHT:  (170.2 cm)  WEIGHT: 190 lb (86.183 kg)    Body mass index is 29.75 kg/(m^2).  NECK SIZE: 16 in.  MEDICATIONS: Charted for review  SLEEP ARCHITECTURE: Total sleep time 192 minutes with sleep efficiency 52%. Stage I was 1.8%, stage II 87.5%, stage III absent, REM 10.7% of total sleep time. Sleep latency 74.5 minutes, REM latency 87.5 minutes, awake after sleep onset 50.5 minutes, arousal index 9.4, bedtime medication: None.    sleep onset around 11:45 PM and he woke spontaneously around 3:45 AM, unable to return to sleep.  RESPIRATORY DATA: Apnea Hypopneas index (AHI) 16.6 per hour. 53 total events scored including 16 obstructive apneas, 1 central apnea, 36 hypopneas. Non-positional events. REM AHI 35.1 per hour. There were not enough early events to allow application of split protocol CPAP titration.  OXYGEN DATA: Moderately loud snoring with oxygen desaturation to a nadir of 84% and mean saturation 95.4% on room air.  CARDIAC DATA: Sinus rhythm with PACs and PVCs  MOVEMENT/PARASOMNIA: No significant movement disturbance, no bathroom trips  IMPRESSION/ RECOMMENDATION:   1) Moderate obstructive sleep apnea/hypopneas syndrome, AHI 16.6 per hour with non-positional events. REM AHI 35.1 per hour. Moderately loud snoring with oxygen desaturation to a nadir of 84% and mean saturation 95.4% on room air. 2) Scores in this range are usually addressed first with CPAP if appropriate. He did not have enough early events to meet protocol requirements for split CPAP titration on this study. Consider return for dedicated  CPAP titration if appropriate. 3) Difficulty initiating and maintaining sleep was noted, as described by the patient also in his home environment. Consider managing as a separate problem of insomnia. If he returns for a CPAP titration study, recommend he bring a sleep medication for use at night.   [name] Diplomate, American Board of Sleep Medicine  ELECTRONICALLY SIGNED ON:  01/06/2014, 9:40 AM [facility] PH: [phone]   FX: [phone] ACCREDITED BY THE AMERICAN ACADEMY OF SLEEP MEDICINE

## 2014-01-09 ENCOUNTER — Telehealth: Payer: Self-pay | Admitting: Internal Medicine

## 2014-01-09 ENCOUNTER — Telehealth: Payer: Self-pay | Admitting: Family Medicine

## 2014-01-09 DIAGNOSIS — G4733 Obstructive sleep apnea (adult) (pediatric): Secondary | ICD-10-CM

## 2014-01-09 MED ORDER — ZOLPIDEM TARTRATE 5 MG PO TABS: 5.0000 mg | ORAL_TABLET | Freq: Every evening | ORAL | Status: DC | PRN

## 2014-01-09 NOTE — Telephone Encounter (Signed)
Patient has called in today to get the results of his sleep study and to also let the PCP (Dr. Armen Pickup) know that he has not been sleeping since; patient would like to have a call back from a nurse about what actions he needs to take next; please f/u with patient

## 2014-01-09 NOTE — Telephone Encounter (Signed)
Called patient. Reviewed sleep study, moderate sleep apnea. Plan: return for CPAP titration, [facility] with [facility].

## 2014-01-09 NOTE — Assessment & Plan Note (Signed)
Recent sleep study 12/31/13

## 2014-01-15 ENCOUNTER — Telehealth: Payer: Self-pay | Admitting: Emergency Medicine

## 2014-01-15 ENCOUNTER — Other Ambulatory Visit: Payer: Self-pay | Admitting: Emergency Medicine

## 2014-01-15 NOTE — Telephone Encounter (Signed)
Pt call requesting results of sleep study test and requesting medication to help him sleep. Per Dr. Armen Pickup note on 01/09/14, Ambien was prescribed and sent to [facility]. I did inform patient of this. Please call pt back to inform of results from sleep study.

## 2014-01-15 NOTE — Telephone Encounter (Signed)
Spoke with [facility] in regards to medication Ambien clarification of quantity Medication ordered with #15, 0 refills and updated to pharmacy Pt also requesting sleep study results- informed pt to call [facility] for results

## 2014-02-09 ENCOUNTER — Telehealth: Payer: Self-pay | Admitting: Family Medicine

## 2014-02-09 DIAGNOSIS — G4733 Obstructive sleep apnea (adult) (pediatric): Secondary | ICD-10-CM

## 2014-02-09 NOTE — Telephone Encounter (Signed)
Pt is calling to request a refill on hiszolpidem (AMBIEN) 5 MG tablet. The pt ran out two days ago and is having trouble sleeping. Pt has an appointment for an CPAP on Oct 18th and is hoping to get enough refills till then.

## 2014-02-12 ENCOUNTER — Telehealth: Payer: Self-pay | Admitting: Family Medicine

## 2014-02-12 MED ORDER — ZOLPIDEM TARTRATE 5 MG PO TABS: 5.0000 mg | ORAL_TABLET | Freq: Every evening | ORAL | Status: DC | PRN

## 2014-02-12 NOTE — Telephone Encounter (Signed)
Please f/u     Pt is calling to request a refill on hiszolpidem (AMBIEN) 5 MG tablet. The pt ran out two days ago and is having trouble sleeping. Pt has an appointment for an CPAP on Oct 18th and is hoping to get enough refills till then.

## 2014-02-12 NOTE — Telephone Encounter (Signed)
Please call in 10 amb[facility]

## 2014-02-16 ENCOUNTER — Other Ambulatory Visit: Payer: Self-pay

## 2014-02-18 ENCOUNTER — Ambulatory Visit (HOSPITAL_BASED_OUTPATIENT_CLINIC_OR_DEPARTMENT_OTHER): Payer: BC Managed Care – PPO | Attending: Family Medicine

## 2014-02-19 ENCOUNTER — Telehealth: Payer: Self-pay | Admitting: Family Medicine

## 2014-02-19 ENCOUNTER — Other Ambulatory Visit: Payer: Self-pay | Admitting: Emergency Medicine

## 2014-02-19 ENCOUNTER — Telehealth: Payer: Self-pay | Admitting: Emergency Medicine

## 2014-02-19 DIAGNOSIS — G4733 Obstructive sleep apnea (adult) (pediatric): Secondary | ICD-10-CM

## 2014-02-19 MED ORDER — ZOLPIDEM TARTRATE 10 MG PO TABS: 10.0000 mg | ORAL_TABLET | Freq: Every evening | ORAL | Status: DC | PRN

## 2014-02-19 NOTE — Telephone Encounter (Signed)
Medication Ambien 10 mg tablet reordered and called in to [facility] pharmacy

## 2014-02-19 NOTE — Telephone Encounter (Signed)
Pt. Returning nurses line. Please f/u with pt.

## 2014-03-19 ENCOUNTER — Other Ambulatory Visit: Payer: Self-pay | Admitting: Family Medicine

## 2014-03-20 ENCOUNTER — Other Ambulatory Visit: Payer: Self-pay | Admitting: Family Medicine

## 2014-03-20 ENCOUNTER — Ambulatory Visit (HOSPITAL_BASED_OUTPATIENT_CLINIC_OR_DEPARTMENT_OTHER): Payer: BC Managed Care – PPO | Attending: Family Medicine | Admitting: Radiology

## 2014-03-20 VITALS — Ht 67.0 in | Wt 189.0 lb

## 2014-03-20 DIAGNOSIS — G4710 Hypersomnia, unspecified: Secondary | ICD-10-CM | POA: Diagnosis present

## 2014-03-20 DIAGNOSIS — G4730 Sleep apnea, unspecified: Secondary | ICD-10-CM | POA: Diagnosis not present

## 2014-03-20 DIAGNOSIS — G4733 Obstructive sleep apnea (adult) (pediatric): Secondary | ICD-10-CM

## 2014-03-24 NOTE — Sleep Study (Signed)
   NAME: Ethan DandyRashawn Defrancesco DATE OF BIRTH:  1975/09/01 MEDICAL RECORD NUMBER [id number]  LOCATION: [facility]  PHYSICIAN: Marticia Reifschneider D  DATE OF STUDY: 03/20/2014  SLEEP STUDY TYPE: Nocturnal Polysomnogram               REFERRING PHYSICIAN: Funches, Josalyn C, MD  INDICATION FOR STUDY: Hypersomnia with sleep apnea-CPAP titration  EPWORTH SLEEPINESS SCORE:   2/24 HEIGHT: 5\' 7"  (170.2 cm)  WEIGHT: 189 lb (85.73 kg)    Body mass index is 29.59 kg/(m^2).  NECK SIZE: 16.5 in.  MEDICATIONS: Charted for review  SLEEP ARCHITECTURE: Total sleep time 277.5 minutes with sleep efficiency 77.5%. Stage I was 10.8%, stage II 65.9%, stage III absent, REM 23.2% of total sleep time. Sleep latency 4 minutes, REM latency 45.5 minutes, awake after sleep onset 18.5 minutes, arousal index 22.7, bedtime medication: None  RESPIRATORY DATA: CPAP titration protocol. CPAP was titrated to 10 CWP, AHI 0 per hour. He wore a fullface mask.  OXYGEN DATA: Snoring was prevented and final CPAP with mean oxygen saturation 96.4% on room air.  CARDIAC DATA: Normal sinus rhythm  MOVEMENT/PARASOMNIA: No significant movement disturbance, bathroom 1  IMPRESSION/ RECOMMENDATION:   1) Successful CPAP titration to 10 CWP, AHI 0 per hour. He wore a medium [employer] Simplus fullface mask with heated humidifier. Snoring was prevented and mean oxygen saturation was 96.4% on room air. 2) Baseline polysomnogram on 12/31/2013 recorded AHI 16.6 per hour with body weight 190 pounds.   Waymon BudgeYOUNG,Atzel Mccambridge D Diplomate, American Board of Sleep Medicine  ELECTRONICALLY SIGNED ON:  03/24/2014, 10:19 AM [place] PH: [phone]   FX: [phone] ACCREDITED BY THE AMERICAN ACADEMY OF SLEEP MEDICINE

## 2014-03-28 ENCOUNTER — Telehealth: Payer: Self-pay | Admitting: Family Medicine

## 2014-03-28 NOTE — Telephone Encounter (Signed)
Ethan Middleton was a short term medicine only to complete sleep studies, no refills.  Sleep study revealed

## 2014-03-28 NOTE — Telephone Encounter (Signed)
Patient calling to request refill for Ambien 10 mg. [facility] uses [facility] on [address]. Patient wants to also inform nurse that he has completed his sleep studies. Patient requesting results from this study. Please assist. °

## 2014-03-28 NOTE — Telephone Encounter (Signed)
Patient calling to request refill for Ambien 10 mg. Patient uses [facility] on [address]. Patient wants to also inform nurse that he has completed his sleep studies. Patient requesting results from this study. Please assist.

## 2014-03-28 NOTE — Telephone Encounter (Signed)
Sleep study reviewed. No snoring and normal O2 sats on CPAP. Will work with sleep study center to order CPAP machine

## 2014-04-06 ENCOUNTER — Other Ambulatory Visit: Payer: Self-pay | Admitting: Family Medicine

## 2014-04-06 DIAGNOSIS — G4733 Obstructive sleep apnea (adult) (pediatric): Secondary | ICD-10-CM

## 2014-04-19 ENCOUNTER — Telehealth: Payer: Self-pay | Admitting: Family Medicine

## 2014-04-19 NOTE — Telephone Encounter (Signed)
Patient has called in today to request an appointment with PCP; patient states he has completed the sleep study and needs a script for Remus Lofflerambien because he is unable to sleep now; please f/u with patient about this request

## 2014-04-19 NOTE — Telephone Encounter (Signed)
Patient states he has completed the sleep study and needs a script for Remus Lofflerambien because he is unable to sleep now.

## 2014-04-19 NOTE — Telephone Encounter (Signed)
Please call back to patient:  1. Ethan Middleton was a short term med only in order to complete sleep study 2. Sleep study confirmed sleep apnea. Order [facility]. CPAP will be primary therapy for insomnia.   Faxed CPAP order to [facility].

## 2014-04-20 ENCOUNTER — Telehealth: Payer: Self-pay | Admitting: Family Medicine

## 2014-04-20 NOTE — Telephone Encounter (Signed)
Pt calling for results of sleep study in order to receive C-PAP machine. Also calling for refill on Ambien. Please f/u with pt.

## 2014-04-25 NOTE — Telephone Encounter (Signed)
Pt Notified. Stated has new PCP [facility]   [name], MD at 04/19/2014 5:08 PM    Status: Signed      Expand All Collapse All    Please call back to patient:  1. Ethan Lofflerambien was a short term med only in order to complete sleep study 2. Sleep study confirmed sleep apnea. Order for CPAP machine [facility]. CPAP will be primary therapy for insomnia.

## 2014-05-02 ENCOUNTER — Telehealth: Payer: Self-pay | Admitting: Family Medicine

## 2014-05-02 NOTE — Telephone Encounter (Signed)
Please call back to patient:  1. Received notification from the call center that the patient was requesting a call back on 04/18/14,  there was no information about what the call back was in regards to.  2. [facility] is inquiring about insurance information to process CPAP request. Patient should call with insurance information. The # is [phone]

## 2014-05-22 ENCOUNTER — Institutional Professional Consult (permissible substitution): Payer: BC Managed Care – PPO | Admitting: Pulmonary Disease

## 2014-06-29 ENCOUNTER — Encounter: Payer: Self-pay | Admitting: Pulmonary Disease

## 2014-06-29 ENCOUNTER — Ambulatory Visit (INDEPENDENT_AMBULATORY_CARE_PROVIDER_SITE_OTHER): Payer: BLUE CROSS/BLUE SHIELD | Admitting: Pulmonary Disease

## 2014-06-29 VITALS — BP 142/80 | HR 76 | Temp 97.0°F | Ht 67.0 in | Wt 197.4 lb

## 2014-06-29 DIAGNOSIS — G4733 Obstructive sleep apnea (adult) (pediatric): Secondary | ICD-10-CM

## 2014-06-29 DIAGNOSIS — F5104 Psychophysiologic insomnia: Secondary | ICD-10-CM | POA: Insufficient documentation

## 2014-06-29 NOTE — Progress Notes (Signed)
Subjective:    Patient ID: Ethan Middleton, male    DOB: [birth year], 39 y.o.   MRN: [id number]  HPI The patient is a 39 year old male who comes in today as a self-referral for evaluation of sleep issues. He has been diagnosed with obstructive sleep apnea last year, with an AHI of 17 events per hour. He was started on C Pap, and his download shows that he is only worn 6 out of the last 23 days, and only averaging about 3-1/2 hours a night. The patient is having difficulties wearing it consistently, and awakens with cough. He is using the heated humidifier, and denies dryness being an issue. Or, he is having significant reflux that is leading to cough during the night while wearing C Pap. He also notes an issue with sleep onset insomnia and maintenance. He will typically not get to sleep until 2:59 AM, and notes that his mind is racing. He will typically sleep for 3-4 hours, and then will awaken and not be able to return to sleep because of his active mind. He will often get up and eat something during the middle of the night, and ultimately starts his day at 7 AM. It should be noted that he travels 3-4 days a week out of town, but stays within the same time zone.  He finds it very inconvenient to travel with his C Pap device.   Sleep Questionnaire What time do you typically go to bed?( Between what hours) 1-3A 1-3A at 1341 on 06/29/14 by Tommie SamsMindy S Silva, CMA How long does it take you to fall asleep? 4-5 hrs 4-5 hrs at 1341 on 06/29/14 by Tommie SamsMindy S Silva, CMA How many times during the night do you wake up? 1 1 at 1341 on 06/29/14 by Tommie SamsMindy S Silva, CMA What time do you get out of bed to start your day? 0700 0700 at 1341 on 06/29/14 by Tommie SamsMindy S Silva, CMA Do you drive or operate heavy machinery in your occupation? Yes Yes at 1341 on 06/29/14 by Tommie SamsMindy S Silva, CMA How much has your weight changed (up or down) over the past two years? (In pounds) 25 lb (11.34 kg) 25 lb (11.34 kg) at 1341 on  06/29/14 by Tommie SamsMindy S Silva, CMA Have you ever had a sleep study before? Yes Yes at 1341 on 06/29/14 by Tommie SamsMindy S Silva, CMA If yes, [facility] at 1341 on 06/29/14 by Tommie SamsMindy S Silva, CMA If yes, date of study? 2015 2015 at 1341 on 06/29/14 by Tommie SamsMindy S Silva, CMA Do you currently use CPAP? Yes Yes at 1341 on 06/29/14 by Tommie SamsMindy S Silva, CMA If so, what pressure? 10 10 at 1341 on 06/29/14 by Tommie SamsMindy S Silva, CMA Do you wear oxygen at any time? No No at 1341 on 06/29/14 by Tommie SamsMindy S Silva, CMA   Review of Systems  Constitutional: Positive for unexpected weight change. Negative for fever.  HENT: Positive for dental problem. Negative for congestion, ear pain, nosebleeds, postnasal drip, rhinorrhea, sinus pressure, sneezing, sore throat and trouble swallowing.   Eyes: Negative for redness and itching.  Respiratory: Positive for cough and shortness of breath. Negative for chest tightness and wheezing.   Cardiovascular: Negative for palpitations and leg swelling.  Gastrointestinal: Negative for nausea and vomiting.  Genitourinary: Negative for dysuria.  Musculoskeletal: Negative for joint swelling.  Skin: Negative for rash.  Neurological: Negative for headaches.  Hematological: Does not bruise/bleed easily.  Psychiatric/Behavioral: Negative for dysphoric mood. The patient is nervous/anxious.  Objective:   Physical Exam Constitutional:  Overweight male, no acute distress  HENT:  Nares patent without discharge, but large turbinates  Oropharynx without exudate, palate and uvula are moderately elongated  Eyes:  Perrla, eomi, no scleral icterus  Neck:  No JVD, no TMG  Cardiovascular:  Normal rate, regular rhythm, no rubs or gallops.  No murmurs        Intact distal pulses  Pulmonary :  Normal breath sounds, no stridor or respiratory distress   No rales, rhonchi, or wheezing  Abdominal:  Soft, nondistended, bowel sounds present.  No tenderness noted.    Musculoskeletal:  No lower extremity edema noted.  Lymph Nodes:  No cervical lymphadenopathy noted  Skin:  No cyanosis noted  Neurologic:  Alert, appropriate, moves all 4 extremities without obvious deficit.         Assessment & Plan:

## 2014-06-29 NOTE — Patient Instructions (Signed)
Will refer to an orthodontist to discuss a dental appliance for your mild to moderate sleep apnea Work on weight loss, since this will resolve your sleep apnea Try melatonin 3mg  about 3-4 hrs before bedtime Take nexium otc one before dinner each night. I would recommend referral to a behavior psychologist for CBT (cognitive behavioral therapy for your insomnia).  Please discuss this with your primary care md. You can try sleep restriction therapy:  Going to bed at 3am and getting up at 7.  Every 2 weeks go to bed 30min earlier.  No napping during day or evening.

## 2014-06-29 NOTE — Assessment & Plan Note (Signed)
The patient has mild to moderate obstructive sleep apnea by his recent sleep study, but is having difficulties with his C Pap. It is aggravating his insomnia, and he feels that it is difficult to travel with. His download shows excellent control of his sleep apnea when he wears it, but that is only about 3 hours a night and only on rare occasions. I have discussed with him the possibility of a dental appliance, and he feels this would be more convenient for him, especially with his travel schedule.

## 2014-06-29 NOTE — Assessment & Plan Note (Signed)
The patient is describing classic psychophysiologic insomnia, with mind racing and also very poor sleep hygiene. I have outlined sleep restriction therapy with him, but ultimately I think he would best benefit from CBT with a behavioral psychologist. I have asked him to try melatonin, but he should avoid sleeping medications at all cost.

## 2014-12-28 ENCOUNTER — Encounter: Payer: Self-pay | Admitting: Gastroenterology

## 2015-02-20 ENCOUNTER — Ambulatory Visit (INDEPENDENT_AMBULATORY_CARE_PROVIDER_SITE_OTHER): Payer: Managed Care, Other (non HMO) | Admitting: Gastroenterology

## 2015-02-20 ENCOUNTER — Encounter: Payer: Self-pay | Admitting: Gastroenterology

## 2015-02-20 VITALS — BP 120/90 | HR 76 | Ht 67.0 in | Wt 203.4 lb

## 2015-02-20 DIAGNOSIS — K900 Celiac disease: Secondary | ICD-10-CM

## 2015-02-20 DIAGNOSIS — K625 Hemorrhage of anus and rectum: Secondary | ICD-10-CM

## 2015-02-20 DIAGNOSIS — R140 Abdominal distension (gaseous): Secondary | ICD-10-CM

## 2015-02-20 NOTE — Progress Notes (Signed)
HPI :  39 y/o male here for consultation for abdominal distension and blood in the stools, new patient to our clinic.   Patient reports a history of upper abdominal pain and bloating. He thinks it has been ongoing for 8 years or so. He has had a prior EGD while living in [place], he thniks 3-4 years ago (no report available today), as well as a breath test, and was told he had celiac disease. He was placed on gluten free diet, he thinks he is compliant with it "90% of the time", he thinks the diet may have helped although his symptoms are not significantly improved over the years.  He reports intermittent significant bloating and gas in his upper abdomen. He otherwise endorses a change in his bowel habits recently, passing some hard, thin stools. He reports he is having a BM once per day or once every 2 days. He juices to help with symptoms of constipation which does help and prefers to manage this with diet. He reports seeing some blood in the stool perhaps twice per month which is new. He has never had a prior colonoscopy. He denies a FH of colon cancer or stomach cancer. No other celiac disease in the family. He smokes 1/2 PPD tobacco. He reports a historyof alcohol use, heavy drinking, he has more recently cut back. 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 He drinks liquer for the most part, avoids beer if he can. He reports he has a hard time losing weight. He takes advil PM most nights to help with sleep. He has been taking omeprazole which has not helped too much. He states he had H pylori testing which was normal.     Past Medical History  Diagnosis Date  . Celiac disease   . OSA (obstructive sleep apnea)   . Insomnia     Past Surgical History  Procedure Laterality Date  . Hernia repair     Family History  Problem Relation Age of Onset  . Arthritis Maternal Uncle   . Asthma Maternal Uncle   . Diabetes Maternal Grandmother   . Asthma Maternal  Grandmother   . Arthritis Maternal Grandmother    Social History  Substance Use Topics  . Smoking status: Current Every Day Smoker -- 0.50 packs/day for 20 years  . Smokeless tobacco: Never Used  . Alcohol Use: 3.6 oz/week    6 Shots of liquor per week     Comment: social   Current Outpatient Prescriptions  Medication Sig Dispense Refill  . Calcium Carb-Cholecalciferol (CALCIUM 600/VITAMIN D3) 600-800 MG-UNIT TABS Take 1 tablet by mouth daily. 90 tablet 3   No current facility-administered medications for this visit.   Allergies  Allergen Reactions  . Gluten Meal     Celiac Disease     Review of Systems: All systems reviewed and negative except where noted in HPI.   Lab Results  Component Value Date   WBC 6.5 12/08/2013   HGB 15.3 12/21/2013   HCT 45.0 12/21/2013   MCV 83.7 12/08/2013   PLT 275 12/08/2013    Lab Results  Component Value Date   ALT 22 12/08/2013   AST 15 12/08/2013   ALKPHOS 70 12/08/2013   BILITOT 0.4 12/08/2013    Lab Results  Component Value Date   CREATININE 0.90 12/21/2013   BUN 11 12/21/2013   NA 141 12/21/2013   K 3.9 12/21/2013   CL 101 12/21/2013   CO2 25 12/08/2013  Physical Exam: BP 120/90 mmHg  Pulse 76  Ht 5\' 7"  (1.702 m)  Wt 203 lb 6.4 oz (92.262 kg)  BMI 31.85 kg/m2 Constitutional: Pleasant,well-developed, male in no acute distress. HEENT: Normocephalic and atraumatic. Conjunctivae are normal. No scleral icterus. Neck supple.  Cardiovascular: Normal rate, regular rhythm.  Pulmonary/chest: Effort normal and breath sounds normal. No wheezing, rales or rhonchi. Abdominal: Soft, nondistended, nontender. Bowel sounds active throughout. There are no masses palpable. No hepatomegaly. Extremities: no edema Lymphadenopathy: No cervical adenopathy noted. Neurological: Alert and oriented to person place and time. Skin: Skin is warm and dry. No rashes noted. Psychiatric: Normal mood and affect. Behavior is  normal.   ASSESSMENT AND PLAN: 39 y/o [place] seen in consultation for abdominal discomfort and bloating, with a reported history of celiac disease. He reports gluten free diet has not significantly helped his symptoms, but is not 100% compliant with it. To clarify his celiac history I have asked to obtain his old records, to confirm his prior diagnosis. If he has celiac, he is having symptoms likely due to dietary noncompliance vs. less likely due to refractory disease. I will send celiac AB at this time to see if positive, and also refer him for EGD to further evaluate this with biopsies of the small bowel. We will also evaluate for H pylori during this exam. I will also obtain baseline CBC to ensure no anemia and CMP. Given his blood in the stools which seems new and his bowel habit changes, I also offered him a colonoscopy to rule out IBD and bleeding polyp / mass lesion. Otherwise, recommend he avoid NSAIDs, using advil every night which could be related. Recommend he use benadryl PRN for insomnia or discuss alternative regimens with his PCM.   The indications, risks, and benefits of EGD and colonoscopy were explained to the patient in detail. Risks include but are not limited to bleeding, perforation, adverse reaction to medications, and cardiopulmonary compromise. Sequelae include but are not limited to the possibility of surgery, hositalization, and mortality. The patient verbalized understanding and wished to proceed. All questions answered, referred to scheduler and bowel prep ordered. Further recommendations pending results of the exam.   Ileene PatrickSteven Orli Degrave, MD [facility] Pager [phone]

## 2015-02-20 NOTE — Patient Instructions (Signed)
Your physician has requested that you go to the basement for lab work before leaving today.  We will call you about scheduling an EGD/Colon.

## 2015-03-14 ENCOUNTER — Encounter: Payer: Self-pay | Admitting: Gastroenterology

## 2015-03-25 ENCOUNTER — Telehealth: Payer: Self-pay | Admitting: Gastroenterology

## 2015-03-25 NOTE — Telephone Encounter (Signed)
Patient's records were dropped off for review. He had lab testing in September 2016 showing negative celiac markers, normal CBC with normal Hgb of 14.6, and normal CMP, with negative H pylori breath test. I question his diagnosis of celiac disease. We will await his scheduled EGD and colonoscopy. Records to be scanned into Epic.

## 2015-05-01 ENCOUNTER — Ambulatory Visit (AMBULATORY_SURGERY_CENTER): Payer: Self-pay | Admitting: *Deleted

## 2015-05-01 VITALS — Ht 67.0 in | Wt 202.0 lb

## 2015-05-01 DIAGNOSIS — K900 Celiac disease: Secondary | ICD-10-CM

## 2015-05-01 DIAGNOSIS — K625 Hemorrhage of anus and rectum: Secondary | ICD-10-CM

## 2015-05-01 MED ORDER — NA SULFATE-K SULFATE-MG SULF 17.5-3.13-1.6 GM/177ML PO SOLN: ORAL | Status: DC

## 2015-05-01 NOTE — Progress Notes (Deleted)
Suprep (no substitutions)-TAKE AS DIRECTED.

## 2015-05-01 NOTE — Progress Notes (Signed)
Patient denies any allergies to eggs or soy. Patient denies any problems with anesthesia/sedation. Patient denies any oxygen use at home and does not take any diet/weight loss medications. EMMI education assisgned to patient on colonoscopy and EGD, this was explained and instructions given to patient.

## 2015-05-13 ENCOUNTER — Emergency Department (HOSPITAL_COMMUNITY): Payer: BLUE CROSS/BLUE SHIELD

## 2015-05-13 ENCOUNTER — Encounter (HOSPITAL_COMMUNITY): Payer: Self-pay

## 2015-05-13 ENCOUNTER — Emergency Department (HOSPITAL_COMMUNITY)
Admission: EM | Admit: 2015-05-13 | Discharge: 2015-05-13 | Disposition: A | Discharge status: Home / Self Care | Payer: BLUE CROSS/BLUE SHIELD | Attending: Emergency Medicine | Admitting: Emergency Medicine

## 2015-05-13 DIAGNOSIS — K529 Noninfective gastroenteritis and colitis, unspecified: Secondary | ICD-10-CM | POA: Diagnosis not present

## 2015-05-13 DIAGNOSIS — F17200 Nicotine dependence, unspecified, uncomplicated: Secondary | ICD-10-CM | POA: Insufficient documentation

## 2015-05-13 DIAGNOSIS — Z8669 Personal history of other diseases of the nervous system and sense organs: Secondary | ICD-10-CM | POA: Insufficient documentation

## 2015-05-13 DIAGNOSIS — Z79899 Other long term (current) drug therapy: Secondary | ICD-10-CM | POA: Diagnosis not present

## 2015-05-13 DIAGNOSIS — R1032 Left lower quadrant pain: Secondary | ICD-10-CM | POA: Diagnosis present

## 2015-05-13 LAB — COMPREHENSIVE METABOLIC PANEL
ALBUMIN: 3.9 g/dL (ref 3.5–5.0)
ALT: 27 U/L (ref 17–63)
AST: 21 U/L (ref 15–41)
Alkaline Phosphatase: 55 U/L (ref 38–126)
Anion gap: 9 (ref 5–15)
BILIRUBIN TOTAL: 0.4 mg/dL (ref 0.3–1.2)
BUN: 8 mg/dL (ref 6–20)
CO2: 26 mmol/L (ref 22–32)
CREATININE: 0.74 mg/dL (ref 0.61–1.24)
Calcium: 9.4 mg/dL (ref 8.9–10.3)
Chloride: 107 mmol/L (ref 101–111)
GFR calc Af Amer: >60 mL/min (ref >60)
GLUCOSE: 103 mg/dL — AB (ref 65–99)
Potassium: 3.6 mmol/L (ref 3.5–5.1)
Sodium: 142 mmol/L (ref 135–145)
TOTAL PROTEIN: 7.1 g/dL (ref 6.5–8.1)

## 2015-05-13 LAB — URINALYSIS, ROUTINE W REFLEX MICROSCOPIC
BILIRUBIN URINE: NEGATIVE
GLUCOSE, UA: NEGATIVE mg/dL
Hgb urine dipstick: NEGATIVE
KETONES UR: NEGATIVE mg/dL
LEUKOCYTES UA: NEGATIVE
Nitrite: NEGATIVE
PH: 6 (ref 5.0–8.0)
PROTEIN: NEGATIVE mg/dL
Specific Gravity, Urine: 1.026 (ref 1.005–1.030)

## 2015-05-13 LAB — CBC
HCT: 40.6 % (ref 39.0–52.0)
Hemoglobin: 13.1 g/dL (ref 13.0–17.0)
MCH: 27.6 pg (ref 26.0–34.0)
MCHC: 32.3 g/dL (ref 30.0–36.0)
MCV: 85.7 fL (ref 78.0–100.0)
PLATELETS: 367 10*3/uL (ref 150–400)
RBC: 4.74 MIL/uL (ref 4.22–5.81)
RDW: 14.2 % (ref 11.5–15.5)
WBC: 6.6 10*3/uL (ref 4.0–10.5)

## 2015-05-13 LAB — LIPASE, BLOOD: Lipase: 40 U/L (ref 11–51)

## 2015-05-13 MED ORDER — CIPROFLOXACIN HCL 500 MG PO TABS: 500.0000 mg | ORAL_TABLET | Freq: Two times a day (BID) | ORAL | Status: DC

## 2015-05-13 MED ORDER — METRONIDAZOLE 500 MG PO TABS: 500.0000 mg | ORAL_TABLET | Freq: Two times a day (BID) | ORAL | Status: DC

## 2015-05-13 MED ORDER — IOHEXOL 300 MG/ML  SOLN
100.0000 mL | Freq: Once | INTRAMUSCULAR | Status: AC | PRN
  Administered 2015-05-13: 100 mL via INTRAVENOUS

## 2015-05-13 MED ORDER — IOHEXOL 300 MG/ML  SOLN
50.0000 mL | Freq: Once | INTRAMUSCULAR | Status: AC | PRN
  Administered 2015-05-13: 50 mL via ORAL

## 2015-05-13 NOTE — ED Provider Notes (Signed)
CSN: [id number]     Arrival date & time 05/13/15  1612 History   First MD Initiated Contact with Patient 05/13/15 1926     Chief Complaint  Patient presents with  . Abdominal Distention   . Emesis  . Diarrhea     (Consider location/radiation/quality/duration/timing/severity/associated sxs/prior Treatment) HPI Comments: Patient has been seen by GI for evaluation of celiac disease and GI bleeding.  He is scheduled for endoscopy and colonoscopy on 05/15/15.  Review of records shows recent testing is negative for celiac markers.    Patient is a 41 y.o. male presenting with vomiting, diarrhea, and abdominal pain. The history is provided by the patient and medical records. No language interpreter was used.  Emesis Severity:  Moderate Duration:  1 week Progression:  Resolved Associated symptoms: abdominal pain and diarrhea   Diarrhea Associated symptoms: abdominal pain and vomiting   Associated symptoms: no fever   Abdominal Pain Pain location:  LLQ Pain quality: fullness   Pain radiates to:  LUQ Pain severity:  Moderate Onset quality:  Gradual Duration:  1 week Timing:  Constant Progression:  Worsening Associated symptoms: diarrhea and vomiting   Associated symptoms: no fever     Past Medical History  Diagnosis Date  . Celiac disease   . OSA (obstructive sleep apnea)   . Insomnia   . Sleep apnea    Past Surgical History  Procedure Laterality Date  . Hernia repair     Family History  Problem Relation Age of Onset  . Arthritis Maternal Uncle   . Asthma Maternal Uncle   . Diabetes Maternal Grandmother   . Asthma Maternal Grandmother   . Arthritis Maternal Grandmother    Social History  Substance Use Topics  . Smoking status: Current Every Day Smoker -- 0.25 packs/day for 20 years  . Smokeless tobacco: Never Used  . Alcohol Use: No     Comment: pt quit x 8 days ago (05/13/15)    Review of Systems  Constitutional: Negative for fever.  Gastrointestinal: Positive for  vomiting, abdominal pain and diarrhea.  All other systems reviewed and are negative.     Allergies  Gluten meal  Home Medications   Prior to Admission medications   Medication Sig Start Date End Date Taking? Authorizing Provider  azelastine (ASTELIN) 0.1 % nasal spray Place 1 spray into both nostrils daily as needed. allergies 04/21/15  Yes Historical Provider, MD  DEXILANT 60 MG capsule Take 1 capsule by mouth daily. 03/14/15  Yes Historical Provider, MD  Na Sulfate-K Sulfate-Mg Sulf SOLN Suprep (no substitutions)-TAKE AS DIRECTED. Patient not taking: Reported on 05/13/2015 05/01/15   Ruffin Frederick, MD   BP 131/86 mmHg  Pulse 99  Temp(Src) 98.2 F (36.8 C) (Oral)  Resp 18  SpO2 97% Physical Exam  Constitutional: He is oriented to person, place, and time. He appears well-developed and well-nourished.  HENT:  Head: Normocephalic.  Eyes: Conjunctivae are normal.  Neck: Neck supple.  Cardiovascular: Normal rate and regular rhythm.   Pulmonary/Chest: Effort normal and breath sounds normal.  Abdominal: Soft. Bowel sounds are normal. He exhibits distension. There is tenderness.  Musculoskeletal: He exhibits no edema or tenderness.  Neurological: He is alert and oriented to person, place, and time.  Skin: Skin is warm and dry.  Psychiatric: He has a normal mood and affect.  Nursing note and vitals reviewed.   ED Course  Procedures (including critical care time) Labs Review Labs Reviewed  COMPREHENSIVE METABOLIC PANEL - Abnormal; Notable for the  following:    Glucose, Bld 103 (*)    All other components within normal limits  LIPASE, BLOOD  CBC  URINALYSIS, ROUTINE W REFLEX MICROSCOPIC (NOT AT [facility])    Imaging Review Ct Abdomen Pelvis W Contrast  05/13/2015  CLINICAL DATA:  40 year old male with history of left lower quadrant abdominal pain and abdominal distention. EXAM: CT ABDOMEN AND PELVIS WITH CONTRAST TECHNIQUE: Multidetector CT imaging of the abdomen and  pelvis was performed using the standard protocol following bolus administration of intravenous contrast. CONTRAST:  50mL OMNIPAQUE IOHEXOL 300 MG/ML SOLN, 100mL OMNIPAQUE IOHEXOL 300 MG/ML SOLN COMPARISON:  CT the abdomen and pelvis 01/22/2013. FINDINGS: Lower chest:  Unremarkable. Hepatobiliary: No cystic or solid hepatic lesions are noted in the visualized portions of the liver (dome of the liver was incompletely visualized on today's examination). No intra or extrahepatic biliary ductal dilatation. Gallbladder is normal in appearance. Pancreas: No pancreatic mass. No pancreatic ductal dilatation. No pancreatic or peripancreatic fluid or inflammatory changes. Spleen: Unremarkable. Adrenals/Urinary Tract: Bilateral kidneys and bilateral adrenal glands are normal in appearance. No hydroureteronephrosis or perinephric stranding. Urinary bladder is normal in appearance. Stomach/Bowel: Normal appearance of the stomach. No pathologic dilatation of small bowel or colon. In knee distal descending colon and proximal sigmoid colon there is some relative thickening of the colonic wall with some mild hypervascularity in the associated mesocolon. This area of the colon is relatively decompressed, such that this may be "pseudothickening", however, the hypervascularity could suggest mild inflammation as can be seen in the setting of mild colitis. Normal appendix. Vascular/Lymphatic: Atherosclerosis throughout the abdominal and pelvic vasculature, without evidence of aneurysm or dissection. Several borderline enlarged retroperitoneal lymph nodes are noted, but are nonspecific. No pathologically enlarged lymph nodes are noted in the abdomen or pelvis. Mildly enlarged right inguinal lymph node measuring up to 1 cm in short axis. Reproductive: Prostate gland and seminal vesicles are unremarkable in appearance. Other: No significant volume of ascites.  No pneumoperitoneum. Musculoskeletal: There are no aggressive appearing lytic or  blastic lesions noted in the visualized portions of the skeleton. IMPRESSION: 1. Findings suggestive of mild colitis in the distal descending colon and proximal sigmoid colon, as discussed above. 2. No other potential acute findings noted in the abdomen or pelvis to account for the patient's symptoms. 3. Normal appendix. 4. Mildly enlarged right inguinal lymph node, likely reactive. Electronically Signed   By: Trudie Reedaniel  Entrikin M.D.   On: 05/13/2015 20:32   I have personally reviewed and evaluated these images and lab results as part of my medical decision-making.   EKG Interpretation None     Lab and radiology results reviewed and shared with patient.   Mild colitis likely. Patient discussed with Dr. Ranae PalmsYelverton. Will initiate treatment with cipro and flagyl. Patient has follow-up with GI on 05/15/15 for endoscopy and colonoscopy. MDM   Final diagnoses:  None    Colitis. Care instructions provided. Return precautions discussed.    Felicie Mornavid Aleksis Jiggetts, NP 05/14/15 [id number]  Loren Raceravid Yelverton, MD 05/14/15 [phone]

## 2015-05-13 NOTE — ED Notes (Signed)
Pt c/o increasing abdominal distention, generalized body aches, and n/v/d x 9 days.  Pain score 8/10.  Pt has not taken anything for symptoms.  Hx of celiac diease.  Sts "the last time I was seen by my PCP they found not traces of the allergy."  Pt has endoscopy and colonoscopy scheduled for 1/11.

## 2015-05-13 NOTE — ED Notes (Signed)
Patient transported to CT 

## 2015-05-13 NOTE — Discharge Instructions (Signed)
°  Colitis °Colitis is inflammation of the colon. Colitis may last a short time (acute) or it may last a long time (chronic). °CAUSES °This condition may be caused by: °· Viruses. °· Bacteria. °· Reactions to medicine. °· Certain autoimmune diseases, such as Crohn disease or ulcerative colitis. °SYMPTOMS °Symptoms of this condition include: °· Diarrhea. °· Passing bloody or tarry stool. °· Pain. °· Fever. °· Vomiting. °· Tiredness (fatigue). °· Weight loss. °· Bloating. °· Sudden increase in abdominal pain. °· Having fewer bowel movements than usual. °DIAGNOSIS °This condition is diagnosed with a stool test or a blood test. You may also have other tests, including X-rays, a CT scan, or a colonoscopy. °TREATMENT °Treatment may include: °· Resting the bowel. This involves not eating or drinking for a period of time. °· Fluids that are given through an IV tube. °· Medicine for pain and diarrhea. °· Antibiotic medicines. °· Cortisone medicines. °· Surgery. °HOME CARE INSTRUCTIONS °Eating and Drinking °· Follow instructions from your health care provider about eating or drinking restrictions. °· Drink enough fluid to keep your urine clear or pale yellow. °· Work with a dietitian to determine which foods cause your condition to flare up. °· Avoid foods that cause flare-ups. °· Eat a well-balanced diet. °Medicines °· Take over-the-counter and prescription medicines only as told by your health care provider. °· If you were prescribed an antibiotic medicine, take it as told by your health care provider. Do not stop taking the antibiotic even if you start to feel better. °General Instructions °· Keep all follow-up visits as told by your health care provider. This is important. °SEEK MEDICAL CARE IF: °· Your symptoms do not go away. °· You develop new symptoms. °SEEK IMMEDIATE MEDICAL CARE IF: °· You have a fever that does not go away with treatment. °· You develop chills. °· You have extreme weakness, fainting, or  dehydration. °· You have repeated vomiting. °· You develop severe pain in your abdomen. °· You pass bloody or tarry stool. °  °This information is not intended to replace advice given to you by your health care provider. Make sure you discuss any questions you have with your health care provider. °  °Document Released: 05/28/2004 Document Revised: 01/09/2015 Document Reviewed: 08/13/2014 °Elsevier Interactive Patient Education ©2016 [employer] ° °

## 2015-05-15 ENCOUNTER — Ambulatory Visit (AMBULATORY_SURGERY_CENTER): Payer: BLUE CROSS/BLUE SHIELD | Admitting: Gastroenterology

## 2015-05-15 ENCOUNTER — Encounter: Payer: Self-pay | Admitting: Gastroenterology

## 2015-05-15 VITALS — BP 140/82 | HR 66 | Temp 97.5°F | Resp 17 | Ht 67.0 in | Wt 203.0 lb

## 2015-05-15 DIAGNOSIS — K209 Esophagitis, unspecified without bleeding: Secondary | ICD-10-CM

## 2015-05-15 DIAGNOSIS — K625 Hemorrhage of anus and rectum: Secondary | ICD-10-CM | POA: Diagnosis not present

## 2015-05-15 DIAGNOSIS — K900 Celiac disease: Secondary | ICD-10-CM | POA: Diagnosis not present

## 2015-05-15 MED ORDER — SODIUM CHLORIDE 0.9 % IV SOLN: 500.0000 mL | INTRAVENOUS | Status: DC

## 2015-05-15 MED ORDER — PANTOPRAZOLE SODIUM 40 MG PO TBEC: 40.0000 mg | DELAYED_RELEASE_TABLET | Freq: Every day | ORAL | Status: DC

## 2015-05-15 NOTE — Progress Notes (Signed)
Called to room to assist during endoscopic procedure.  Patient ID and intended procedure confirmed with present staff. Received instructions for my participation in the procedure from the performing physician.  

## 2015-05-15 NOTE — Op Note (Signed)
[facility] [address] [place], [id number]   ENDOSCOPY PROCEDURE REPORT  PATIENT: Ethan Middleton, Ethan Middleton  MR#: [id number] BIRTHDATE: [birth year] , 39  yrs. old GENDER: male ENDOSCOPIST: Benancio DeedsSteven P Clinten Howk, MD REFERRED BY: PROCEDURE DATE:  05/15/2015 PROCEDURE:  EGD w/ biopsy ASA CLASS:     Class II INDICATIONS:  dyspepsia and reported history of celiac disease. MEDICATIONS: Lidocaine 40 mg IV TOPICAL ANESTHETIC:  DESCRIPTION OF PROCEDURE: After the risks benefits and alternatives of the procedure were thoroughly explained, informed consent was obtained.  The LB [id number] endoscope was introduced through the mouth and advanced to the second portion of the duodenum , Without limitations.  The instrument was slowly withdrawn as the mucosa was fully examined.   FINDINGS: There was LA grade B esophagitis noted at the SCJ.  The remainder of the esophagus was normal.  DH noted 40cm from the incisors, with GEJ and SCJ located 38cm from the incisors, with a 2cm hiatal hernia.  The examined stomach was normal, biopsies were taken from the antrum and body to rule out H pylori.  The duodenal bulb and 2nd portion of the duodenum appeared normal, without overt inflammatory changes concerning for celiac disease, but biopsies were taken to evaluate for celiac disease.  Retroflexed views revealed no abnormalities.     The scope was then withdrawn from the patient and the procedure completed.  COMPLICATIONS: There were no immediate complications.  ENDOSCOPIC IMPRESSION: LA grade B esophagitis 2cm hiatal hernia Normal stomach - biopsies obtained Normal duodenum - biopsies obtained   RECOMMENDATIONS: Await pathology results Resume diet Resume medications Chart in Epic shows the patient was prescribed Dexilant by another provider, recommend you take this daily if not already taking. If you are not taking any PPI, take Dexilant if you have it, if not, we can  prescribe another PPI. If taking Dexilant, recommend adding Zantac 150mg  BID to this regimen given endoscopic findings today    eSigned:  Benancio DeedsSteven P Ranisha Allaire, MD 05/15/2015 2:03 PM    CC: the patient  PATIENT NAME:  Ethan Middleton, Ethan Middleton MR#: [id number]

## 2015-05-15 NOTE — Progress Notes (Signed)
Stable to RR 

## 2015-05-15 NOTE — Patient Instructions (Addendum)
YOU HAD AN ENDOSCOPIC PROCEDURE TODAY AT THE [facility]:   Refer to the procedure report that was given to you for any specific questions about what was found during the examination.  If the procedure report does not answer your questions, please call your gastroenterologist to clarify.  If you requested that your care partner not be given the details of your procedure findings, then the procedure report has been included in a sealed envelope for you to review at your convenience later.  YOU SHOULD EXPECT: Some feelings of bloating in the abdomen. Passage of more gas than usual.  Walking can help get rid of the air that was put into your GI tract during the procedure and reduce the bloating. If you had a lower endoscopy (such as a colonoscopy or flexible sigmoidoscopy) you may notice spotting of blood in your stool or on the toilet paper. If you underwent a bowel prep for your procedure, you may not have a normal bowel movement for a few days.  Please Note:  You might notice some irritation and congestion in your nose or some drainage.  This is from the oxygen used during your procedure.  There is no need for concern and it should clear up in a day or so.  SYMPTOMS TO REPORT IMMEDIATELY:   Following lower endoscopy (colonoscopy or flexible sigmoidoscopy):  Excessive amounts of blood in the stool  Significant tenderness or worsening of abdominal pains  Swelling of the abdomen that is new, acute  Fever of 100F or higher   Following upper endoscopy (EGD)  Vomiting of blood or coffee ground material  New chest pain or pain under the shoulder blades  Painful or persistently difficult swallowing  New shortness of breath  Fever of 100F or higher  Black, tarry-looking stools  For urgent or emergent issues, a gastroenterologist can be reached at any hour by calling [phone].   DIET: Your first meal following the procedure should be a small meal and then it is ok to progress to  your normal diet. Heavy or fried foods are harder to digest and may make you feel nauseous or bloated.  Likewise, meals heavy in dairy and vegetables can increase bloating.  Drink plenty of fluids but you should avoid alcoholic beverages for 24 hours.  Try to increase the fiber in your diet. It will help with the hemorrhoids and diverticulosis.  ACTIVITY:  You should plan to take it easy for the rest of today and you should NOT DRIVE or use heavy machinery until tomorrow (because of the sedation medicines used during the test).    FOLLOW UP: Our staff will call the number listed on your records the next business day following your procedure to check on you and address any questions or concerns that you may have regarding the information given to you following your procedure. If we do not reach you, we will leave a message.  However, if you are feeling well and you are not experiencing any problems, there is no need to return our call.  We will assume that you have returned to your regular daily activities without incident.  If any biopsies were taken you will be contacted by phone or by letter within the next 1-3 weeks.  Please call us at [phone] if you have not heard about the biopsies in 3 weeks.  We will call you if you have a bacteria in your stomach called "H-pylori." Other results wll be sent to you in a  letter in about 10 days.   SIGNATURES/CONFIDENTIALITY: You and/or your care partner have signed paperwork which will be entered into your electronic medical record.  These signatures attest to the fact that that the information above on your After Visit Summary has been reviewed and is understood.  Full responsibility of the confidentiality of this discharge information lies with you and/or your care-partner.  Please, take your pantoprazole 40mg  1/2 hr before breakfast everyday, . Read all of the handouts given to you by your recovery room nurse. Thank-you for choosing us for your  healthcare needs.and you may increase it to twice daily if needed.

## 2015-05-15 NOTE — Op Note (Signed)
[facility] [address] [place], [id number]   COLONOSCOPY PROCEDURE REPORT  PATIENT: Ethan Middleton, [place]  MR#: [id number] BIRTHDATE: 1975-10-07 , 39  yrs. old GENDER: male ENDOSCOPIST: Benancio DeedsSteven P Brailey Buescher, MD REFERRED BY: PROCEDURE DATE:  05/15/2015 PROCEDURE:   Colonoscopy, diagnostic and Colonoscopy with biopsy  ASA CLASS:   Class II INDICATIONS:Evaluation of unexplained rectal bleeding, Colorectal Neoplasm Risk Assessment for this procedure is average risk, and abnormal CT abdomen showing left sided colitis in the setting of recent symptoms of diarrhea / abdominal discomfort. MEDICATIONS: Propofol 200 mg IV  DESCRIPTION OF PROCEDURE:   After the risks benefits and alternatives of the procedure were thoroughly explained, informed consent was obtained.  The digital rectal exam revealed no abnormalities of the rectum.   The LB [id number]  endoscope was introduced through the anus and advanced to the terminal ileum which was intubated for a short distance. No adverse events experienced.   The quality of the prep was good.  The instrument was then slowly withdrawn as the colon was fully examined. Estimated blood loss is zero unless otherwise noted in this procedure report.  COLON FINDINGS: Mild superficial patchy erythema without ulceration or erosion was noted in the sigmoid colon.  Biopsies were taken in the area.  Mild diverticulosis was noted in the left colon.  The remainder of the examined colon was normal without inflammatory changes.  The ileum was intubated and appeared normal without inflammatory changes.  No polyps or mass lesions noted. Retroflexed views revealed internal hemorrhoids. The time to cecum = 2.0 Withdrawal time = 11.2   The scope was withdrawn and the procedure completed. COMPLICATIONS: There were no immediate complications.  ENDOSCOPIC IMPRESSION: Mild inflammatory changes of the left colon, most consistent with infectious  colitis based on CT, biopsies taken to ensure no evidence of IBD Mild left sided diverticulosis Normal remainder of examined colon and ileum Internal hemorrhoids which I suspect are the most likely cause of the patient's intermittent bleeding  RECOMMENDATIONS: Daily fiber supplement for treatment of hemorrhoids Resume diet Resume medications Await pathology results  eSigned:  Benancio DeedsSteven P Reene Harlacher, MD 05/15/2015 2:09 PM cc:  the patient

## 2015-05-16 ENCOUNTER — Telehealth: Payer: Self-pay

## 2015-05-16 NOTE — Telephone Encounter (Signed)
  Follow up Call-  Call back number 05/15/2015  Post procedure Call Back phone  # [phone]  Permission to leave phone message Yes     Patient questions:  Do you have a fever, pain , or abdominal swelling? No. Pain Score  0 *  Have you tolerated food without any problems? Yes.    Have you been able to return to your normal activities? Yes.    Do you have any questions about your discharge instructions: Diet   No. Medications  No. Follow up visit  No.  Do you have questions or concerns about your Care? No.  Actions: * If pain score is 4 or above: No action needed, pain <4.

## 2015-05-27 ENCOUNTER — Other Ambulatory Visit: Payer: Self-pay | Admitting: *Deleted

## 2015-05-28 ENCOUNTER — Encounter: Payer: Self-pay | Admitting: *Deleted

## 2015-06-07 ENCOUNTER — Telehealth: Payer: Self-pay | Admitting: Gastroenterology

## 2015-06-07 NOTE — Telephone Encounter (Signed)
Spoke with patient and scheduled OV for banding on 07/11/15 at 10:30 AM. Patient to come for labs that he has not completed .

## 2015-07-11 ENCOUNTER — Encounter: Payer: BLUE CROSS/BLUE SHIELD | Admitting: Gastroenterology

## 2015-07-25 ENCOUNTER — Encounter: Payer: BLUE CROSS/BLUE SHIELD | Admitting: Gastroenterology

## 2015-08-19 ENCOUNTER — Other Ambulatory Visit (INDEPENDENT_AMBULATORY_CARE_PROVIDER_SITE_OTHER): Payer: BLUE CROSS/BLUE SHIELD

## 2015-08-19 DIAGNOSIS — R140 Abdominal distension (gaseous): Secondary | ICD-10-CM | POA: Diagnosis not present

## 2015-08-19 DIAGNOSIS — K900 Celiac disease: Secondary | ICD-10-CM

## 2015-08-19 DIAGNOSIS — K625 Hemorrhage of anus and rectum: Secondary | ICD-10-CM

## 2015-08-19 LAB — CBC WITH DIFFERENTIAL/PLATELET
BASOS ABS: 0.1 10*3/uL (ref 0.0–0.1)
Basophils Relative: 0.8 % (ref 0.0–3.0)
Eosinophils Absolute: 0.2 10*3/uL (ref 0.0–0.7)
Eosinophils Relative: 3.4 % (ref 0.0–5.0)
HCT: 41.7 % (ref 39.0–52.0)
Hemoglobin: 14 g/dL (ref 13.0–17.0)
LYMPHS ABS: 2.7 10*3/uL (ref 0.7–4.0)
Lymphocytes Relative: 43.1 % (ref 12.0–46.0)
MCHC: 33.5 g/dL (ref 30.0–36.0)
MCV: 81.9 fl (ref 78.0–100.0)
MONO ABS: 0.6 10*3/uL (ref 0.1–1.0)
Monocytes Relative: 9.9 % (ref 3.0–12.0)
NEUTROS ABS: 2.7 10*3/uL (ref 1.4–7.7)
NEUTROS PCT: 42.8 % — AB (ref 43.0–77.0)
PLATELETS: 322 10*3/uL (ref 150.0–400.0)
RBC: 5.1 Mil/uL (ref 4.22–5.81)
RDW: 14.1 % (ref 11.5–15.5)
WBC: 6.3 10*3/uL (ref 4.0–10.5)

## 2015-08-19 LAB — COMPREHENSIVE METABOLIC PANEL
ALK PHOS: 61 U/L (ref 39–117)
ALT: 17 U/L (ref 0–53)
AST: 13 U/L (ref 0–37)
Albumin: 4.1 g/dL (ref 3.5–5.2)
BILIRUBIN TOTAL: 0.2 mg/dL (ref 0.2–1.2)
BUN: 12 mg/dL (ref 6–23)
CO2: 24 meq/L (ref 19–32)
Calcium: 9 mg/dL (ref 8.4–10.5)
Chloride: 105 mEq/L (ref 96–112)
Creatinine, Ser: 0.88 mg/dL (ref 0.40–1.50)
GFR: 122.54 mL/min (ref >60.00)
GLUCOSE: 112 mg/dL — AB (ref 70–99)
POTASSIUM: 3.9 meq/L (ref 3.5–5.1)
SODIUM: 138 meq/L (ref 135–145)
TOTAL PROTEIN: 7 g/dL (ref 6.0–8.3)

## 2015-08-19 LAB — IGA: IgA: 244 mg/dL (ref 68–378)

## 2015-08-20 LAB — TISSUE TRANSGLUTAMINASE, IGA: Tissue Transglutaminase Ab, IgA: 1 U/mL (ref <4)

## 2015-09-24 ENCOUNTER — Ambulatory Visit (INDEPENDENT_AMBULATORY_CARE_PROVIDER_SITE_OTHER): Payer: BLUE CROSS/BLUE SHIELD | Admitting: Gastroenterology

## 2015-09-24 ENCOUNTER — Encounter: Payer: Self-pay | Admitting: Gastroenterology

## 2015-09-24 VITALS — BP 124/90 | HR 80 | Ht 67.0 in | Wt 208.2 lb

## 2015-09-24 DIAGNOSIS — K649 Unspecified hemorrhoids: Secondary | ICD-10-CM | POA: Diagnosis not present

## 2015-09-24 NOTE — Progress Notes (Signed)
PROCEDURE NOTE: The patient presents with symptomatic grade II  hemorrhoids, requesting rubber band ligation of his/her hemorrhoidal disease.  All risks, benefits and alternative forms of therapy were described and informed consent was obtained.   The anorectum was pre-medicated with 0.125% nitroglycerin The decision was made to band the LL internal hemorrhoid, and the [facility] O'Regan System was used to perform band ligation without complication.  Digital anorectal examination was then performed to assure proper positioning of the band, and to adjust the banded tissue as required.  The patient was discharged home without pain or other issues.  Dietary and behavioral recommendations were given and along with follow-up instructions.     The following adjunctive treatments were recommended: Daily fiber supplement  The patient will return in 2-3 weeks for  follow-up and possible additional banding as required. No complications were encountered and the patient tolerated the procedure well.  [place], MD [facility] Pager [phone]   

## 2015-09-24 NOTE — Patient Instructions (Signed)
We made you an appointment with DR. Armbruster for banding # 2, 10-29-2015 at 3:45 PM.

## 2015-10-23 ENCOUNTER — Encounter: Payer: BLUE CROSS/BLUE SHIELD | Admitting: Gastroenterology

## 2015-10-29 ENCOUNTER — Encounter: Payer: Self-pay | Admitting: Gastroenterology

## 2015-10-29 ENCOUNTER — Ambulatory Visit (INDEPENDENT_AMBULATORY_CARE_PROVIDER_SITE_OTHER): Payer: BLUE CROSS/BLUE SHIELD | Admitting: Gastroenterology

## 2015-10-29 VITALS — BP 150/98 | HR 86 | Ht 67.0 in | Wt 205.0 lb

## 2015-10-29 DIAGNOSIS — K648 Other hemorrhoids: Secondary | ICD-10-CM | POA: Diagnosis not present

## 2015-10-29 NOTE — Progress Notes (Signed)
PROCEDURE NOTE: The patient presents with symptomatic grade II  hemorrhoids, requesting rubber band ligation of his/her hemorrhoidal disease.  All risks, benefits and alternative forms of therapy were described and informed consent was obtained.   The anorectum was pre-medicated with 0.125% nitroglycerin The decision was made to band the RA internal hemorrhoid, and the [facility] System was used to perform band ligation without complication.  Digital anorectal examination was then performed to assure proper positioning of the band, and to adjust the banded tissue as required.  The patient was discharged home without pain or other issues.  Dietary and behavioral recommendations were given and along with follow-up instructions.     The following adjunctive treatments were recommended: Daily fiber supplement  The patient will return in 2-4 weeks for  follow-up and possible additional banding as required. No complications were encountered and the patient tolerated the procedure well.

## 2015-10-29 NOTE — Patient Instructions (Signed)

## 2015-12-09 ENCOUNTER — Encounter: Payer: BLUE CROSS/BLUE SHIELD | Admitting: Gastroenterology

## 2015-12-29 ENCOUNTER — Other Ambulatory Visit: Payer: Self-pay | Admitting: Gastroenterology

## 2015-12-29 DIAGNOSIS — K209 Esophagitis, unspecified without bleeding: Secondary | ICD-10-CM

## 2016-03-10 ENCOUNTER — Encounter: Payer: BLUE CROSS/BLUE SHIELD | Admitting: Gastroenterology

## 2016-04-06 ENCOUNTER — Ambulatory Visit (INDEPENDENT_AMBULATORY_CARE_PROVIDER_SITE_OTHER): Payer: BLUE CROSS/BLUE SHIELD | Admitting: Gastroenterology

## 2016-04-06 ENCOUNTER — Encounter: Payer: Self-pay | Admitting: Gastroenterology

## 2016-04-06 VITALS — BP 114/78 | HR 80 | Ht 67.0 in | Wt 205.2 lb

## 2016-04-06 DIAGNOSIS — K649 Unspecified hemorrhoids: Secondary | ICD-10-CM

## 2016-04-06 NOTE — Progress Notes (Signed)
PROCEDURE NOTE: The patient presents with symptomatic grade II  hemorrhoids, requesting rubber band ligation of his/her hemorrhoidal disease.  All risks, benefits and alternative forms of therapy were described and informed consent was obtained.   The anorectum was pre-medicated with 0.125% nitroglycerin The decision was made to band the RP internal hemorrhoid, and the [facility] System was used to perform band ligation without complication.  Digital anorectal examination was then performed to assure proper positioning of the band, and to adjust the banded tissue as required.  The patient was discharged home without pain or other issues.  Dietary and behavioral recommendations were given and along with follow-up instructions.     The following adjunctive treatments were recommended: Low FODMAP diet for bloating  The patient will return as needed for  follow-up and possible additional banding as required, he has completed all 3 bandings to date at this time. No complications were encountered and the patient tolerated the procedure well.  Ileene PatrickSteven Armbruster, MD [facility] Pager [phone]

## 2016-04-06 NOTE — Patient Instructions (Signed)
If you are age 42 or older, your body mass index should be between 23-30. Your Body mass index is 32.15 kg/m. If this is out of the aforementioned range listed, please consider follow up with your Primary Care Provider.  If you are age 42 or younger, your body mass index should be between 19-25. Your Body mass index is 32.15 kg/m. If this is out of the aformentioned range listed, please consider follow up with your Primary Care Provider.   HEMORRHOID BANDING PROCEDURE    FOLLOW-UP CARE   1. The procedure you have had should have been relatively painless since the banding of the area involved does not have nerve endings and there is no pain sensation.  The rubber band cuts off the blood supply to the hemorrhoid and the band may fall off as soon as 48 hours after the banding (the band may occasionally be seen in the toilet bowl following a bowel movement). You may notice a temporary feeling of fullness in the rectum which should respond adequately to plain Tylenol or Motrin.  2. Following the banding, avoid strenuous exercise that evening and resume full activity the next day.  A sitz bath (soaking in a warm tub) or bidet is soothing, and can be useful for cleansing the area after bowel movements.     3. To avoid constipation, take two tablespoons of natural wheat bran, natural oat bran, flax, Benefiber or any over the counter fiber supplement and increase your water intake to 7-8 glasses daily.    4. Unless you have been prescribed anorectal medication, do not put anything inside your rectum for two weeks: No suppositories, enemas, fingers, etc.  5. Occasionally, you may have more bleeding than usual after the banding procedure.  This is often from the untreated hemorrhoids rather than the treated one.  Don't be concerned if there is a tablespoon or so of blood.  If there is more blood than this, lie flat with your bottom higher than your head and apply an ice pack to the area. If the bleeding  does not stop within a half an hour or if you feel faint, call our office at [phone] or go to the emergency room.  6. Problems are not common; however, if there is a substantial amount of bleeding, severe pain, chills, fever or difficulty passing urine (very rare) or other problems, you should call us at [phone] or report to the nearest emergency room.  7. Do not stay seated continuously for more than 2-3 hours for a day or two after the procedure.  Tighten your buttock muscles 10-15 times every two hours and take 10-15 deep breaths every 1-2 hours.  Do not spend more than a few minutes on the toilet if you cannot empty your bowel; instead re-visit the toilet at a later time.    Please follow up as needed.  Thank you.

## 2016-06-22 ENCOUNTER — Encounter (HOSPITAL_COMMUNITY): Payer: Self-pay | Admitting: Emergency Medicine

## 2016-06-22 ENCOUNTER — Emergency Department (HOSPITAL_COMMUNITY)
Admission: EM | Admit: 2016-06-22 | Discharge: 2016-06-23 | Disposition: A | Discharge status: Home / Self Care | Payer: BLUE CROSS/BLUE SHIELD | Attending: Emergency Medicine | Admitting: Emergency Medicine

## 2016-06-22 DIAGNOSIS — I10 Essential (primary) hypertension: Secondary | ICD-10-CM | POA: Insufficient documentation

## 2016-06-22 DIAGNOSIS — F17200 Nicotine dependence, unspecified, uncomplicated: Secondary | ICD-10-CM | POA: Insufficient documentation

## 2016-06-22 DIAGNOSIS — Z79899 Other long term (current) drug therapy: Secondary | ICD-10-CM | POA: Diagnosis not present

## 2016-06-22 LAB — BASIC METABOLIC PANEL
Anion gap: 7 (ref 5–15)
BUN: 12 mg/dL (ref 6–20)
CHLORIDE: 103 mmol/L (ref 101–111)
CO2: 27 mmol/L (ref 22–32)
Calcium: 9.2 mg/dL (ref 8.9–10.3)
Creatinine, Ser: 0.88 mg/dL (ref 0.61–1.24)
GFR calc Af Amer: >60 mL/min (ref >60)
GFR calc non Af Amer: >60 mL/min (ref >60)
Glucose, Bld: 101 mg/dL — ABNORMAL HIGH (ref 65–99)
Potassium: 3.3 mmol/L — ABNORMAL LOW (ref 3.5–5.1)
SODIUM: 137 mmol/L (ref 135–145)

## 2016-06-22 LAB — CBC
HCT: 40 % (ref 39.0–52.0)
HEMOGLOBIN: 13.8 g/dL (ref 13.0–17.0)
MCH: 27.8 pg (ref 26.0–34.0)
MCHC: 34.5 g/dL (ref 30.0–36.0)
MCV: 80.5 fL (ref 78.0–100.0)
Platelets: 234 10*3/uL (ref 150–400)
RBC: 4.97 MIL/uL (ref 4.22–5.81)
RDW: 13.7 % (ref 11.5–15.5)
WBC: 5 10*3/uL (ref 4.0–10.5)

## 2016-06-22 NOTE — ED Triage Notes (Signed)
Pt from home with complaints of dizziness and hypertension that began around 1500 today. Pt states he was "playing with a coworkers blood pressure cuff" and realized his blood pressure was reading 150/120. Pt also began feeling dizzy around the same time. Pt states his head feels "fuzzy" Pt has never been diagnosed with hypertension and last saw his PCP for an unrelated concern around 2 months ago.   Pt has unremarkable neuro exam

## 2016-06-22 NOTE — ED Provider Notes (Signed)
[facility]-EMERGENCY DEPT Provider Note   CSN: [id number] Arrival date & time: 06/22/16  1944  By signing my name below, I, Teofilo Pod, attest that this documentation has been prepared under the direction and in the presence of Gilda Crease, MD . Electronically Signed: Teofilo Pod, ED Scribe. 06/22/2016. 11:18 PM.    History   Chief Complaint Chief Complaint  Patient presents with  . Hypertension  . Dizziness    The history is provided by the patient. No language interpreter was used.   HPI Comments:  Ethan Middleton is a 41 y.o. male who presents to the Emergency Department complaining of a sudden onset dizziness that occurred today at 1500. Pt states that he suddenly felt like "his head was full." Pt was at work and there was a blood pressure machine there, so he checked his BP and it was elevated. Pt complains of associated headache and SOB that has since resolved. Pt reports that earlier he was having difficult speaking, but this has since resolved. No alleviating factors noted. Pt denies any current pain, numbness, weakness.    Past Medical History:  Diagnosis Date  . Celiac disease   . Insomnia   . Internal hemorrhoids   . OSA (obstructive sleep apnea)   . Sleep apnea     Patient Active Problem List   Diagnosis Date Noted  . 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 Vitamin D insufficiency 12/22/2013  . Celiac disease 12/22/2013  . 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 Moderate obstructive sleep apnea 12/21/2013  . Screening for STD (sexually transmitted disease) 12/21/2013  . Fatigue 12/21/2013  . 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 HEMORRHOID BANDING    . HERNIA REPAIR         Home Medications    Prior to Admission medications   Medication Sig Start Date End Date Taking? Authorizing Provider  pantoprazole (PROTONIX) 40 MG tablet  TAKE 1 TABLET (40 MG TOTAL) BY MOUTH DAILY. 12/30/15  Yes Napoleon Form, MD  lisinopril (PRINIVIL,ZESTRIL) 5 MG tablet Take 1 tablet (5 mg total) by mouth daily. 06/23/16   Gilda Crease, MD    Family History Family History  Problem Relation Age of Onset  . Arthritis Maternal Uncle   . Asthma Maternal Uncle   . Diabetes Maternal Grandmother   . Asthma Maternal Grandmother   . Arthritis Maternal Grandmother   . Colon polyps Neg Hx   . Stomach cancer Neg Hx     Social History Social History  Substance Use Topics  . Smoking status: Current Every Day Smoker    Packs/day: 0.50    Years: 20.00  . Smokeless tobacco: Never Used  . Alcohol use 2.4 oz/week    4 Cans of beer per week     Allergies   Gluten meal   Review of Systems Review of Systems  HENT: Positive for congestion.   Musculoskeletal: Negative for myalgias.  Neurological: Positive for dizziness, speech difficulty and light-headedness. Negative for weakness and numbness.  All other systems reviewed and are negative.    Physical Exam Updated Vital Signs BP (!) 149/104 (BP Location: Right Arm)   Pulse 72   Temp 98.3 F (36.8 C) (Oral)   Resp 20   Ht 5\' 6"  (1.676 m)   Wt 201 lb (91.2 kg)   SpO2 97%   BMI 32.44 kg/m   Physical Exam  Constitutional:  He is oriented to person, place, and time. He appears well-developed and well-nourished. No distress.  HENT:  Head: Normocephalic and atraumatic.  Right Ear: Hearing normal.  Left Ear: Hearing normal.  Nose: Nose normal.  Mouth/Throat: Oropharynx is clear and moist and mucous membranes are normal.  Eyes: Conjunctivae and EOM are normal. Pupils are equal, round, and reactive to light.  Neck: Normal range of motion. Neck supple.  Cardiovascular: Regular rhythm, S1 normal and S2 normal.  Exam reveals no gallop and no friction rub.   No murmur heard. Pulmonary/Chest: Effort normal and breath sounds normal. No respiratory distress. He exhibits no  tenderness.  Abdominal: Soft. Normal appearance and bowel sounds are normal. There is no hepatosplenomegaly. There is no tenderness. There is no rebound, no guarding, no tenderness at McBurney's point and negative Murphy's sign. No hernia.  Musculoskeletal: Normal range of motion.  Neurological: He is alert and oriented to person, place, and time. He has normal strength. No cranial nerve deficit or sensory deficit. Coordination normal. GCS eye subscore is 4. GCS verbal subscore is 5. GCS motor subscore is 6.  Skin: Skin is warm, dry and intact. No rash noted. No cyanosis.  Psychiatric: He has a normal mood and affect. His speech is normal and behavior is normal. Thought content normal.  Nursing note and vitals reviewed.    ED Treatments / Results  DIAGNOSTIC STUDIES:  Oxygen Saturation is 100% on RA, normal by my interpretation.    COORDINATION OF CARE:  11:18 PM  Discussed treatment plan with pt at bedside and pt agreed to plan.   Labs (all labs ordered are listed, but only abnormal results are displayed) Labs Reviewed  BASIC METABOLIC PANEL - Abnormal; Notable for the following:       Result Value   Potassium 3.3 (*)    Glucose, Bld 101 (*)    All other components within normal limits  CBC    EKG  EKG Interpretation None       Radiology No results found.  Procedures Procedures (including critical care time)  Medications Ordered in ED Medications - No data to display   Initial Impression / Assessment and Plan / ED Course  I have reviewed the triage vital signs and the nursing notes.  Pertinent labs & imaging results that were available during my care of the patient were reviewed by me and considered in my medical decision making (see chart for details).     Patient presents for evaluation of dizziness and elevated blood pressure. Patient had been feeling dizzy through the course of today. He checked his blood pressure and found his blood pressure was elevated.  He does not report a previous history of elevated blood pressure. Patient's neurologic examination is normal. No chest pain or shortness of breath. No stroke symptoms. 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 he is back to baseline at this time. Basic labs are unremarkable, no sign of end organ damage. Will prescribe low-dose lisinopril, have patient check blood pressure daily. Start lisinopril if BP remains elevated. check at primary care physician's office one week.  Final Clinical Impressions(s) / ED Diagnoses   Final diagnoses:  Essential hypertension    New Prescriptions New Prescriptions   LISINOPRIL (PRINIVIL,ZESTRIL) 5 MG TABLET    Take 1 tablet (5 mg total) by mouth daily.  I personally performed the services described in  this documentation, which was scribed in my presence. The recorded information has been reviewed and is accurate.     Gilda Crease, MD 06/23/16 [phone]

## 2016-06-23 MED ORDER — LISINOPRIL 5 MG PO TABS
5.0000 mg | ORAL_TABLET | Freq: Every day | ORAL | 3 refills | Status: DC
Start: 2016-06-23 — End: 2016-08-31

## 2016-07-06 ENCOUNTER — Ambulatory Visit (INDEPENDENT_AMBULATORY_CARE_PROVIDER_SITE_OTHER): Payer: BLUE CROSS/BLUE SHIELD

## 2016-07-06 ENCOUNTER — Ambulatory Visit (INDEPENDENT_AMBULATORY_CARE_PROVIDER_SITE_OTHER): Payer: BLUE CROSS/BLUE SHIELD | Admitting: Podiatry

## 2016-07-06 ENCOUNTER — Encounter: Payer: Self-pay | Admitting: Podiatry

## 2016-07-06 VITALS — BP 125/86 | HR 86 | Resp 16 | Ht 67.0 in | Wt 200.0 lb

## 2016-07-06 DIAGNOSIS — M109 Gout, unspecified: Secondary | ICD-10-CM

## 2016-07-06 DIAGNOSIS — M722 Plantar fascial fibromatosis: Secondary | ICD-10-CM

## 2016-07-06 MED ORDER — PREDNISONE 10 MG PO TABS: ORAL_TABLET | ORAL | 0 refills | Status: DC

## 2016-07-06 MED ORDER — TRIAMCINOLONE ACETONIDE 10 MG/ML IJ SUSP
10.0000 mg | Freq: Once | INTRAMUSCULAR | Status: AC
  Administered 2016-07-06: 10 mg

## 2016-07-06 NOTE — Patient Instructions (Addendum)

## 2016-07-06 NOTE — Progress Notes (Signed)
   Subjective:    Patient ID: Ethan Middleton, male    DOB: [birth year], 41 y.o.   MRN: [id number]  HPI Chief Complaint  Patient presents with  . Foot Pain    Bilateral; bottom of heel and back of heel; dorsal; pt stated, "Feels knots on top of foot; saw Dr. Elijah Birkom last year; got inserts for shoes that were too big"  \    Review of Systems  Musculoskeletal: Positive for arthralgias and back pain.  All other systems reviewed and are negative.      Objective:   Physical Exam        Assessment & Plan:

## 2016-07-06 NOTE — Progress Notes (Signed)
Subjective:     Patient ID: Ethan Middleton, male   DOB: 1975-09-02, 41 y.o.   MRN: [id number]  HPI patient states she's had pain in the heels of several year duration and states that the right one has bothered more than the left but now the left hurts more than the right. Has had previous cortisone injections which of The symptoms somewhat reduced but still present   Review of Systems  All other systems reviewed and are negative.      Objective:   Physical Exam  Constitutional: He is oriented to person, place, and time.  Cardiovascular: Intact distal pulses.   Musculoskeletal: Normal range of motion.  Neurological: He is oriented to person, place, and time.  Skin: Skin is warm.  Nursing note and vitals reviewed.  neurovascular status is found to be intact with muscle strength adequate range of motion within normal limits. Patient's noted to have edema in discomfort of the heel region bilateral with inflammation fluid which is been present for several years and is worse the more he stands on it. Patient does have good digital perfusion and is well oriented 3     Assessment:     Inflammatory fasciitis bilateral with possibility for systemic inflammatory condition given the length of time she's had this problem    Plan:     H&P and x-rays reviewed. Today injected the plantar fascia bilateral 3 mg Kenalog 5 mill grams Xylocaine and applied fascial brace bilateral and placed on Sterapred DS 12 day Dosepak. I also ordered blood work to rule out any type of arthritides it could be part of this problem and will review results with him in 2 weeks  X-ray indicates that there is no signs of fracture or advanced arthritic condition

## 2016-07-20 ENCOUNTER — Ambulatory Visit: Payer: BLUE CROSS/BLUE SHIELD | Admitting: Podiatry

## 2016-08-30 ENCOUNTER — Emergency Department (HOSPITAL_COMMUNITY)
Admission: EM | Admit: 2016-08-30 | Discharge: 2016-08-31 | Disposition: A | Discharge status: Home / Self Care | Payer: BLUE CROSS/BLUE SHIELD | Attending: Emergency Medicine | Admitting: Emergency Medicine

## 2016-08-30 DIAGNOSIS — R202 Paresthesia of skin: Secondary | ICD-10-CM

## 2016-08-30 DIAGNOSIS — F17200 Nicotine dependence, unspecified, uncomplicated: Secondary | ICD-10-CM | POA: Insufficient documentation

## 2016-08-30 DIAGNOSIS — I10 Essential (primary) hypertension: Secondary | ICD-10-CM | POA: Diagnosis not present

## 2016-08-30 DIAGNOSIS — E876 Hypokalemia: Secondary | ICD-10-CM | POA: Insufficient documentation

## 2016-08-30 LAB — BASIC METABOLIC PANEL
ANION GAP: 10 (ref 5–15)
BUN: 11 mg/dL (ref 6–20)
CHLORIDE: 98 mmol/L — AB (ref 101–111)
CO2: 27 mmol/L (ref 22–32)
Calcium: 9 mg/dL (ref 8.9–10.3)
Creatinine, Ser: 0.87 mg/dL (ref 0.61–1.24)
GFR calc non Af Amer: >60 mL/min (ref >60)
Glucose, Bld: 104 mg/dL — ABNORMAL HIGH (ref 65–99)
Potassium: 3.3 mmol/L — ABNORMAL LOW (ref 3.5–5.1)
Sodium: 135 mmol/L (ref 135–145)

## 2016-08-30 LAB — URINALYSIS, ROUTINE W REFLEX MICROSCOPIC
Bilirubin Urine: NEGATIVE
Glucose, UA: NEGATIVE mg/dL
Hgb urine dipstick: NEGATIVE
Ketones, ur: NEGATIVE mg/dL
LEUKOCYTES UA: NEGATIVE
NITRITE: NEGATIVE
Protein, ur: NEGATIVE mg/dL
SPECIFIC GRAVITY, URINE: 1.003 — AB (ref 1.005–1.030)
pH: 5 (ref 5.0–8.0)

## 2016-08-30 MED ORDER — POTASSIUM CHLORIDE CRYS ER 20 MEQ PO TBCR
40.0000 meq | EXTENDED_RELEASE_TABLET | Freq: Once | ORAL | Status: AC
  Administered 2016-08-31: 40 meq via ORAL
  Filled 2016-08-30: qty 2

## 2016-08-30 NOTE — ED Triage Notes (Signed)
States about 1500 pm today numbness and tingling in hand and not feel right around his head no pain voiced clear speech noted moves all extremities with good strength noted no deficits noted.  States took and extra blood pressure medication blood pressure at home diastolic pressure 95.  States felt like this a month ago when he came here and started on blood pressure medication.

## 2016-08-30 NOTE — ED Provider Notes (Signed)
[facility]-EMERGENCY DEPT Provider Note   CSN: [id number] Arrival date & time: 08/30/16  2121     History   Chief Complaint Chief Complaint  Patient presents with  . Numbness    HPI Ethan Middleton is a 41 y.o. male.  HPI  Pt presenting with c/o hypertension presenting with c/o parasthesias and tingling in bilateral hands and bilateral scalp intermittently throughout the day today.  He states he has been under a lot of stress to do with his job today.  No weakness of extremities.  No headache.  No changes in vision or speech.  No fainting.  No chest pain or shortness of breath.  Pt states symptoms are similar to when he was diagnosed with hypertension. He took his BP at home and it was elevated, so he came to the ED for evaluation.  He currently has no symptoms.  There are no other associated systemic symptoms, there are no other alleviating or modifying factors.   Past Medical History:  Diagnosis Date  . Celiac disease   . Insomnia   . Internal hemorrhoids   . OSA (obstructive sleep apnea)   . Sleep apnea     Patient Active Problem List   Diagnosis Date Noted  . 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 Vitamin D insufficiency 12/22/2013  . Celiac disease 12/22/2013  . 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 Moderate obstructive sleep apnea 12/21/2013  . Screening for STD (sexually transmitted disease) 12/21/2013  . Fatigue 12/21/2013  . 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 HEMORRHOID BANDING    . HERNIA REPAIR         Home Medications    Prior to Admission medications   Medication Sig Start Date End Date Taking? Authorizing Provider  losartan-hydrochlorothiazide (HYZAAR) 50-12.5 MG tablet Take 1 tablet by mouth daily. 06/25/16  Yes Historical Provider, MD  pantoprazole (PROTONIX) 40 MG tablet Take 40 mg by mouth daily. 06/18/16  Yes Historical Provider, MD     Family History Family History  Problem Relation Age of Onset  . Arthritis Maternal Uncle   . Asthma Maternal Uncle   . Diabetes Maternal Grandmother   . Asthma Maternal Grandmother   . Arthritis Maternal Grandmother   . Colon polyps Neg Hx   . Stomach cancer Neg Hx     Social History Social History  Substance Use Topics  . Smoking status: Current Every Day Smoker    Packs/day: 0.50    Years: 20.00  . Smokeless tobacco: Never Used  . Alcohol use 2.4 oz/week    4 Cans of beer per week     Allergies   Gluten meal   Review of Systems Review of Systems  ROS reviewed and all otherwise negative except for mentioned in HPI   Physical Exam Updated Vital Signs BP 120/79 (BP Location: Right Arm)   Pulse 71   Temp 98.3 F (36.8 C) (Oral)   Resp 20   Wt 91.6 kg   SpO2 100%   BMI 31.64 kg/m  Vitals reviewed Physical Exam Physical Examination: General appearance - alert, well appearing, and in no distress Mental status - alert, oriented to person, place, and time Eyes - PERRL,EOMI Mouth - mucous membranes moist, pharynx normal without lesions Neck - supple, no significant adenopathy Chest - clear to auscultation, no wheezes, rales or rhonchi, symmetric air entry Heart - normal rate, regular rhythm, normal S1, S2, no murmurs,  rubs, clicks or gallops Abdomen - soft, nontender, nondistended, no masses or organomegaly Neurological - alert, oriented x 3, cranial nerves 2-12 tested and intact, strength 5/5 in extremities x 4, sensation intact Extremities - peripheral pulses normal, no pedal edema, no clubbing or cyanosis Skin - normal coloration and turgor, no rashes  ED Treatments / Results  Labs (all labs ordered are listed, but only abnormal results are displayed) Labs Reviewed  URINALYSIS, ROUTINE W REFLEX MICROSCOPIC - Abnormal; Notable for the following:       Result Value   Color, Urine COLORLESS (*)    Specific Gravity, Urine 1.003 (*)    All other  components within normal limits  BASIC METABOLIC PANEL - Abnormal; Notable for the following:    Potassium 3.3 (*)    Chloride 98 (*)    Glucose, Bld 104 (*)    All other components within normal limits    EKG  EKG Interpretation  Date/Time:  Sunday August 30 2016 22:33:00 EDT Ventricular Rate:  76 PR Interval:    QRS Duration: 116 QT Interval:  397 QTC Calculation: 447 R Axis:   15 Text Interpretation:  Sinus rhythm Nonspecific intraventricular conduction delay ST elev, probable normal early repol pattern No significant change since last tracing Confirmed by Karma Ganja  MD, Syesha Thaw [phone]) on 08/30/2016 10:48:51 PM Also confirmed by Karma Ganja  MD, Tolbert Matheson [phone]), editor Haskel Khan, Shannon ([id number])  on 08/31/2016 7:05:58 AM       Radiology No results found.  Procedures Procedures (including critical care time)  Medications Ordered in ED Medications  potassium chloride SA (K-DUR,KLOR-CON) CR tablet 40 mEq (40 mEq Oral Given 08/31/16 0002)     Initial Impression / Assessment and Plan / ED Course  I have reviewed the triage vital signs and the nursing notes.  Pertinent labs & imaging results that were available during my care of the patient were reviewed by me and considered in my medical decision making (see chart for details).     Pt presenting with c/o parasthesias, he does have some mild hypertension, mild hypokalemia which was repleted.  No signs of end organ damage,  Normal neuro exam.  Discharged with strict return precautions.  Pt agreeable with plan.  Final Clinical Impressions(s) / ED Diagnoses   Final diagnoses:  Paresthesia  Essential hypertension  Hypokalemia    New Prescriptions Discharge Medication List as of 08/31/2016 12:19 AM       Jerelyn Scott, MD 08/31/16 2020

## 2016-08-31 NOTE — ED Notes (Signed)
bp measured 120/79 map 93

## 2016-08-31 NOTE — Discharge Instructions (Signed)
Return to the ED with any concerns including chest pain, difficulty breathing, fainting, weakness of arms or legs, changes in vision or speech, decreased level of alertness/lethargy, or any other alarming symptoms °

## 2017-04-09 ENCOUNTER — Ambulatory Visit: Admitting: Ophthalmology

## 2017-04-09 LAB — HX HEM-ROUTINE
HX BASO #: 0 10*3/uL (ref 0.0–0.2)
HX BASO: 0 %
HX EOSIN #: 0.1 10*3/uL (ref 0.0–0.5)
HX EOSIN: 1 %
HX HCT: 39.9 % (ref 37.0–47.0)
HX HGB: 13.2 g/dL — ABNORMAL LOW (ref 13.5–16.0)
HX IMMATURE GRANULOCYTE#: 0.1 10*3/uL (ref 0.0–0.1)
HX IMMATURE GRANULOCYTE: 1 %
HX LYMPH #: 3.4 10*3/uL (ref 1.0–4.0)
HX LYMPH: 37 %
HX MCH: 27.8 pg (ref 26.0–34.0)
HX MCHC: 33.1 g/dL (ref 32.0–36.0)
HX MCV: 84 fL (ref 80.0–98.0)
HX MONO #: 0.9 10*3/uL — ABNORMAL HIGH (ref 0.2–0.8)
HX MONO: 10 %
HX MPV: 8.5 fL — ABNORMAL LOW (ref 9.1–11.7)
HX NEUT #: 4.6 10*3/uL (ref 1.5–7.5)
HX NRBC #: 0 10*3/uL
HX NUCLEATED RBC: 0 %
HX PLT: 310 10*3/uL (ref 150–400)
HX RBC BLOOD COUNT: 4.75 M/uL (ref 4.20–5.50)
HX RDW: 14.4 % (ref 11.5–14.5)
HX SEG NEUT: 51 %
HX WBC: 9.2 10*3/uL (ref 4.0–11.0)

## 2017-04-09 LAB — HX IMMUNOLOGY: HX C-REACTIVE PROTEIN - CRP: 1.91 mg/L (ref 0.00–7.48)

## 2017-04-09 LAB — HX HEM-MISC: HX SED RATE: 6 mm (ref 0–15)

## 2017-04-09 LAB — HX CHEM-PANELS
HX ANION GAP: 6 (ref 3–14)
HX BLOOD UREA NITROGEN: 11 mg/dL (ref 6–24)
HX CHLORIDE (CL): 105 meq/L (ref 98–110)
HX CO2: 28 meq/L (ref 20–30)
HX CREATININE (CR): 0.74 mg/dL (ref 0.57–1.30)
HX GFR, AFRICAN AMERICAN: 131 mL/min/{1.73_m2}
HX GFR, NON-AFRICAN AMERICAN: 113 mL/min/{1.73_m2}
HX GLUCOSE: 83 mg/dL (ref 70–139)
HX POTASSIUM (K): 4.1 meq/L (ref 3.6–5.1)
HX SODIUM (NA): 139 meq/L (ref 135–145)

## 2017-04-09 LAB — HX CHEM-OTHER
HX ALBUMIN: 4 g/dL (ref 3.4–4.8)
HX CALCIUM (CA): 9.4 mg/dL (ref 8.5–10.5)
HX LIPASE: 29 IU/L (ref 8–60)
HX MAGNESIUM: 2.2 mg/dL (ref 1.6–2.6)
HX PHOSPHORUS: 3.4 mg/dL (ref 2.7–4.5)

## 2017-04-09 LAB — HX CHEM-LFT
HX ALANINE AMINOTRANSFERASE (ALT/SGPT): 31 IU/L (ref 0–54)
HX ALKALINE PHOSPHATASE (ALK): 53 IU/L (ref 40–130)
HX ASPARTATE AMINOTRANFERASE (AST/SGOT): 15 IU/L (ref 10–42)
HX BILI INDIRECT: 0.3 mg/dL (ref 0.1–0.7)
HX BILIRUBIN, DIRECT: 0.2 mg/dL (ref 0.0–0.3)
HX BILIRUBIN, TOTAL: 0.5 mg/dL (ref 0.2–1.1)
HX LACTATE DEHYDROGENASE (LDH): 186 IU/L (ref 120–220)

## 2017-04-09 LAB — HX CHEM-ENZ-FRAC: HX CREATINE KINASE (CPK/CK): 45 IU/L (ref 20–210)

## 2017-04-09 LAB — HX DIABETES: HX GLUCOSE: 83 mg/dL (ref 70–139)

## 2017-04-09 NOTE — ED Provider Notes (Signed)
.  .  Name: Willford, Rabideau  MRN: [id number]  Age: 41 yrs  Sex: Male  DOB: [birth year]  Arrival Date: 04/09/2017  Arrival Time: 12:12  Account#: [id number]  Bed A18  PCP: No Pcp, Per, PATIENT  Chief Complaint: Leg Pain  .  Presentation:  12/07  12:12 Presenting complaint: EMS states: EMS reports 5 days ago pt     jm64        developed flu like symptoms including HA, Cough, Fever, Cough.        Two days ago pt started to see symptoms improve but started to        have lower back pain. Now this morning pt reports pain from        feet to slightly above knees with a clear demarcation of no        pain about both knees. Pain is burning and pt is unable to        ambulate.  12:12 Method Of Arrival: EMS: [facility]                              jm64  12:12 Acuity: Adult 3                                                 jm64  .  Historical:  - Allergies:  12:16 No known drug Allergies;                                        jm64  - Home Meds:  12:16 Lexapro Oral [Active]; losartan oral oral [Active];             jm64  - PMHx:  12:16 Hypertension; Depression;                                       jm64  - PSHx:  12:16 Hernia repair;                                                  jm64  .  - Social history: Smoking status: Patient uses tobacco    products, current every day smoker. No barriers to    communication noted, The patient lives with spouse, Visiting    from out of town The patient works as a Art therapist.  - Family history: No immediate family members are acutely ill.  - The history from nurses notes was reviewed: and I agree with    what is documented. and I agree with what is documented.  .  .  Screening:  12:16 SEPSIS SCREENING SIRS Criteria (> = 2) No. Safety screen:       jm64        Patient feels safe. Suicide Screening: Patients presentation:        No risk factors. Fall Risk No fall in past 12 months (0 pts).        Secondary diagnosis (15 points) impaired mobility, No IV (0  pts). Ambulatory Aid-  None/Bed Rest/Nurse Assist (0 pts). Gait-        Normal/Bed Rest/Wheelchair (0 pts) Mental Status- Oriented to        own ability (0 pts). Total Morse Fall Scale indicates No Risk        (0-24 pts). Exposure Risk/Travel Screening: None identified.  .  Vital Signs:  .  Name:Gebert, Elson  [id number]  [id number]  Page 1 of 4  %%PAGE  .  Name: Gevork, Ayyad  MRN: [id number]  Age: 4 yrs  Sex: Male  DOB: 1975-10-09  Arrival Date: 04/09/2017  Arrival Time: 12:12  Account#: [id number]  Bed A18  PCP: No Pcp, Per, PATIENT  Chief Complaint: Leg Pain  .  12:16 BP 118 / 82; Pulse 88; Resp 16; Temp 36.9; Pulse Ox 98% on R/A; jm64        Pain 10/10;  15:35 BP 128 / 81; Pulse 73; Resp 17; Pulse Ox 100% on R/A;           fb7  18:20 BP 122 / 77; Pulse 74; Resp 16; Pulse Ox 99% on R/A;            fb7  19:30 BP 128 / 71; Pulse 76; Resp 16; Pulse Ox 99% on R/A;            md20  23:44 BP 133 / 82 Sitting; Pulse 78; Resp 15; Pulse Ox 98% on R/A;    nd10        Pain 0/10;  .  Neuro Vital Signs:  19:30 GCS: 15,                                                        md20  19:38 GCS: 15,                                                        md20  .  Triage Assessment:  12:16 General: Appears in no apparent distress. Pain: Complains of    jm64        pain in right leg and left leg the pain radiates to the: Pain        radiates to right foot and left leg Pain currently is 10 out of        10 on a pain scale. Quality of pain is described as tingling,        Pain began 2-3 days ago. Neuro: No deficits noted.  .  Assessment:  15:07 General: Assumed care of this Patient following his placement   jg8        in the bed. The Patient is awake, alert and oriented. Reports        he was at a Conference all week. States went to bed and felt        well last evening. States woke with severe pain in his lower        legs. States tried to ambulate to the BR but could not. States        he is unable to ambulate. The Patient was evaluated by  MD. Lorenz Coaster  established and labs drawn. Toradol given with little effect.        The Patient has had no significant change in condition since        his arrival. Neurology in with Patient. Respiratory in to due        vital capacity and NIF. Thoughts are potential Guillain Barre.  15:50 Reassessment: Reports left leg feeling stronger.                fb7  19:24 Reassessment: Reports no change in leg weakness. Alert and      fb7        oriented. Eating sandwiches and taking PO fluids. Waiting for        MRI.  19:38 General: Appears in no apparent distress, Behavior is           md20        cooperative, pleasant. Pain: Pain currently is 7 out of 10 on a        pain scale. Cardiovascular: No deficits noted. Respiratory: No        deficits noted. O2 Sat spot check. Skin: Skin is normal.  .  Observations:  12:12 Patient arrived in ED.                                          jm64  12:15 Triage Completed.                                               jm64  .  Name:Banas, Jonaven  [id number]  [id number]  Page 2 of 4  %%PAGE  .  Name: Brantlee, Penn  MRN: [id number]  Age: 34 yrs  Sex: Male  DOB: Dec 29, 1975  Arrival Date: 04/09/2017  Arrival Time: 12:12  Account#: [id number]  Bed A18  PCP: No Pcp, Per, PATIENT  Chief Complaint: Leg Pain  .  12:43 Patient Visited By: Finis Bud                              erw  13:58 Patient Visited By: Lorraine Lax                          fdf  14:07 Patient Visited By: Lorraine Lax                          fdf  14:08 Patient assigned to Y78                                         GN56  15:28 Neurology staff.                                                fb7  15:37 Respiratory staff evaluation in progress.                       fb7  15:49 Registration completed.  bs  19:10 Nurse's Note: MRI contacted for estimated exam time "will call  fb7        back".  19:38 No apparent distress.                                            md20  22:00 Patient moved to MRI.                                           md20  23:44 Nurse's Note: patient ambulates with steady gait but slow, he   nd10        denies any chest pain SOB, denies dizziness, denies numbness or        tingling.  .  Procedure:  15:12 Inserted peripheral IV: 20 gauge in left antecubital area and   jg8        blood (including any ordered blood cultures) drawn.  17:28 Labs drawn. (by ED staff). Sent per order to lab.               fb7  .  Dispensed Medications:  13:39 Drug: Toradol 10 mg Route: IVP;                                 jg8  14:58 Drug: NS - Sodium Chloride 0.9% IV ml 1000 mL Route: IV; Rate:  jm64        Bolus;  16:35 Drug: oxyCODONE 5 mg Route: PO;                                 fb7  16:36 Drug: Toradol 30 mg Route: IVP;                                 fb7  16:36 Drug: Decadron - Dexamethasone 10 mg Route: IV;                 fb7  16:36 Drug: Valium 5 mg Route: PO;                                    fb7  .  .  Interventions:  12:39 Demo Sheet Scanned into Chart                                   tl10  12:51 Demo Sheet Scanned into Chart                                   nm6  12:51 Insurance Card Scanned into Chart                               st15  12:52 EMS Sheet Scanned into Chart  tl10  19:38 Patient Interventions patient placed in exam room. Armband on   md20        Placed in gown. Verbal reassurance given. Warm blanket given.        Diet: Patient given regular meal. Patient given juice.  20:50 Other: MRI SCREENING FORM Scanned into Chart                    mc31  .  Outcome:  14:07 Decision to Hospitalize by Provider.                            fdf  .  Name:Mehlberg, Desma Paganini  [id number]  [id number]  Page 3 of 4  %%PAGE  .  Name: Jerold, Yoss  MRN: [id number]  Age: 43 yrs  Sex: Male  DOB: [birth year]  Arrival Date: 04/09/2017  Arrival Time: 12:12  Account#: [id number]  Bed A18  PCP: No Pcp, Per, PATIENT  Chief Complaint: Leg  Pain  .  23:25 Hospitalize undone.                                             jms  23:25 Discharge ordered by MD.                                        jms  23:44 Discharged to home ambulatory, with family. Condition: stable   nd10        Condition: improved. Discharge instructions given to patient,        Instructed on discharge instructions, follow up and referral        plans. Demonstrated understanding of instructions. Discharge        Assessment: Patient awake and alert. awake. Chart Status        Nursing note complete and electronically signed.  23:49 Patient left the ED.                                            md20  .  Signatures:  Lorraine Lax                      MD   fdf  Barbette Merino                          MD   jms  Soohoo, Bernice                         Reg  bs  Verl Dicker                         RN   jg8  Carchide, [place]                      Sec  mc31  Charyl Dancer                        RN   fb7  Mat Carne  RN   Marian Sorrow                            BSN  [address]                             tl10  Mok1, [place]                           Sec  nm6  Blima Dessert                         BSN  nd10  Kris Hartmann                              [address]                             Reg  st15  Finis Bud                          MD   erw  .  .  .  .  .  .  .  .  .  .  .  .  .  .  .  .  .  .  Name:Gubser, Desma Paganini  [id number]  [id number]  Page 4 of 4  .  %%END

## 2017-04-09 NOTE — ED Provider Notes (Signed)
Marland Kitchen  Name: Jeremy Torres, Jeremy Torres  MRN: [id number]  Age: 41 yrs  Sex: Male  DOB: 1975/10/27  Arrival Date: 04/09/2017  Arrival Time: 12:12  Account#: [id number]  .  Working Diagnosis: Weakness  PCP: No Pcp, Per, PATIENT  .  HPI:  12/07  22:50 This 41 yrs old Black Male presents to ER via EMS with          erw        complaints of Leg Pain.  22:50 41yo M history of HTN, depression presents with bilateral LE    erw        ascending pain and weakness. Reports that 1 week ago, patient        had viral-like illness including sweats, body aches, diarrhea,        cough and was given a 2 day course of Prednisone and 10 day        course of Abx by PCP. The symptoms soon resolved. Last night,        the pt reports developing lower back pain. When he woke up this        AM, he reports persistent LBP and new onset b/l foot pain.        Since this AM, the pt reports that the pain has progressed from        his feet up to his mid-thighs bilaterally. He describes the        pain as severe pressure "like an elephant is sitting on him"        and tingling. Pain worse on movement. He reports progressive LE        weakness and was unable to walk this morning, so he called EMS.        He is currently on a work trip from [place], scheduled to go        home today. Denies any fevers/chills, SOB/CP. No bowel or        bladder incontinence. No dysuria. No hematochezia. Reports        feeling otherwise well.  Marland Kitchen  Historical:  - Allergies: No known drug Allergies;  - Home Meds: Lexapro Oral; losartan oral oral;  - PMHx: Hypertension; Depression;  - PSHx: Hernia repair;  - Social history: Smoking status: Patient uses tobacco    products, current every day smoker. No barriers to    communication noted, The patient lives with spouse, Visiting    from out of town The patient works as a Art therapist.  - Family history: No immediate family members are acutely ill.  - The history from nurses notes was reviewed: and I agree with    what is  documented. and I agree with what is documented.  .  .  ROS:  22:59 Constitutional: Negative for chills, fatigue, fever, poor PO    erw        intake.  22:59 Eyes: Negative for blurry vision.  22:59 ENT: Negative for rhinorrhea, sore throat.  22:59 Neck Negative for injury or acute deformity.  22:59 Cardiovascular: Negative for chest pain, palpitations.  .  Name:Jeremy Torres, Jeremy Torres  [id number]  [id number]  Page 1 of 8  %%PAGE  .  Name: Jeremy Torres, Jeremy Torres  MRN: [id number]  Age: 41 yrs  Sex: Male  DOB: July 02, 1975  Arrival Date: 04/09/2017  Arrival Time: 12:12  Account#: [id number]  .  Working Diagnosis: Weakness  PCP: No Pcp, Per, PATIENT  .  22:59 Respiratory: Negative for cough, shortness of breath.  22:59 Abdomen/GI: Positive for diarrhea, Negative for abdominal pain,        nausea, vomiting, black/tarry stool.  22:59 Back: Positive for pain at rest, pain with movement, radiated        pain.  22:59 GU: Negative for urinary frequency, burning with urination,        bladder incontinence.  22:59 MS/extremity: Positive for radiated pain, tingling, Negative        for rash, swelling, warmth.  22:59 Neuro: Positive for weakness, Negative for altered mental        status, dizziness, numbness.  .  Vital Signs:  12:16 BP 118 / 82; Pulse 88; Resp 16; Temp 36.9; Pulse Ox 98% on R/A; jm64        Pain 10/10;  15:35 BP 128 / 81; Pulse 73; Resp 17; Pulse Ox 100% on R/A;           fb7  18:20 BP 122 / 77; Pulse 74; Resp 16; Pulse Ox 99% on R/A;            fb7  19:30 BP 128 / 71; Pulse 76; Resp 16; Pulse Ox 99% on R/A;            md20  23:44 BP 133 / 82 Sitting; Pulse 78; Resp 15; Pulse Ox 98% on R/A;    nd10        Pain 0/10;  .  Neuro Vital Signs:  19:30 GCS: 15,                                                        md20  19:38 GCS: 15,                                                        md20  .  Exam:  23:02 Constitutional: The patient appears alert, well developed,      erw        awake, in obvious pain.  23:02 Eyes:  Pupils: equal, round, and reactive to light. Extraocular        movements: intact throughout.  23:02 Respiratory: the patient does not display signs of respiratory        distress,  Respirations: normal, Breath sounds: are normal,        clear throughout.  23:02 Cardiovascular: Rate: normal, Rhythm: regular.  23:02 Abdomen/GI: Inspection: abdomen appears normal, Palpation:        abdomen is soft and non-tender.  23:02 Back: ROM is painful, with flexion, vertebral tenderness, is        not appreciated, Straight leg raises: pain bilaterally.  23:02 Musculoskeletal/extremity: ROM: intact in all extremities.  23:02 Neuro: Orientation: to person, place / time. Mentation: is        normal, Cranial nerves: grossly normal, Motor: Dorsiflexion and        Plantar flexion 4/5 bilaterally. Remainder of motor strength  .  Name:Jeremy Torres, Jeremy Torres  [id number]  [id number]  Page 2 of 8  %%PAGE  .  Name: Jeremy Torres, Jeremy Torres  MRN: [id number]  Age: 41 yrs  Sex: Male  DOB: 12-24-1975  Arrival Date: 04/09/2017  Arrival Time: 12:12  Account#: [id number]  .  Working Diagnosis: Weakness  PCP: No Pcp, Per, PATIENT  .        5/5, Sensation: is normal, tingling, that is mild, Gait: is        unsteady, limited by pain, Deep tendon reflexes are 1 (trace) +        in the  right patellar, right Achilles, left patellar and left        Achilles.  23:03 Neuro: Orientation:                                             jms  .  MDM:  16:18 Data reviewed: lab test result(s), nurses notes, radiologic     fdf        studies, vital signs. Data interpreted: Pulse oximetry:        Interpretation: normal. Response to treatment: Improved. A        consult was requested from: Neurology.  16:18 Counseling: I had a detailed discussion with the patient and/or fdf        guardian regarding: Observation.  .  12/07  13:12 Order name: BUN (Blood Urea Nitrogen); Complete Time: 13:59     erw  12/07  13:12 Order name: CBC/Diff (With Plt); Complete Time: 13:49            erw  12/07  13:12 Order name: CR (Creatinine); Complete Time: 13:59               erw  12/07  13:12 Order name: GLU (Glucose); Complete Time: 13:59                 erw  12/07  13:12 Order name: LYTES (Na, K, Cl, Co2); Complete Time: 13:59        erw  12/07  13:47 Order name: NRBC; Complete Time: 13:49                          dispa  t  12/07  13:58 Order name: GFR, AA; Complete Time: 13:59                       dispa  t  12/07  13:59 Order name: GFR, NAA; Complete Time: 13:59                      dispa  t  12/07  15:38 Order name: Creatine Kinase (Cpk/Ck); Complete Time: 18:32      erw  12/07  16:10 Order name: Mr Spine Lumbar                                     fdf  12/07  14:09 Order name: Refer to ED-OBS; Complete Time: 15:05               fdf  12/07  14:09 Order name: Vital signs q 2 hours; Complete Time: 15:05         fdf  12/07  14:09 Order name: ADD CALCIUM/CA; Complete Time: 14:11                fdf  12/07  14:09 Order name: ADD PHOSPHORUS; Complete Time: 14:11                fdf  .  Name:Jeremy Torres, Jeremy Torres  [id number]  [id number]  Page 3 of 8  %%PAGE  .  Name: Jeremy Torres, Jeremy Torres  MRN: [id number]  Age: 62 yrs  Sex: Male  DOB: [birth year]  Arrival Date: 04/09/2017  Arrival Time: 12:12  Account#: [id number]  .  Working Diagnosis: Weakness  PCP: No Pcp, Per, PATIENT  .  12/07  14:09 Order name: ADD MAGNESIUM; Complete Time: 14:11                 fdf  12/07  14:09 Order name: ADD ALBUMIN; Complete Time: 14:12                   fdf  12/07  14:09 Order name: ADD ALKALINE PHOSPHATASE; Complete Time: 14:12      fdf  12/07  14:09 Order name: ADD ALT/SGPT; Complete Time: 14:12                  fdf  12/07  14:09 Order name: ADD AST/SGOT; Complete Time: 14:12                  fdf  12/07  14:09 Order name: ADD BILI DIRECT; Complete Time: 14:12               fdf  12/07  14:09 Order name: ADD BILI TOTAL; Complete Time: 14:12                fdf  12/07  14:09 Order name: ADD LIPASE; Complete Time: 14:12                     fdf  12/07  14:09 Order name: ADD ESR; Complete Time: 14:12                       fdf  12/07  14:09 Order name: ADD CRP; Complete Time: 14:12                       fdf  12/07  14:09 Order name: Neuro Checks q 2 hours; Complete Time: 15:05        fdf  .  Dispensed Medications:  13:39 Drug: Toradol 10 mg Route: IVP;                                 jg8  14:58 Drug: NS - Sodium Chloride 0.9% IV ml 1000 mL Route: IV; Rate:  jm64        Bolus;  16:35 Drug: oxyCODONE 5 mg Route: PO;                                 fb7  16:36 Drug: Toradol 30 mg Route: IVP;                                 fb7  16:36 Drug: Decadron - Dexamethasone 10 mg Route: IV;                 fb7  16:36 Drug: Valium 5 mg Route: PO;                                    fb7  .  .  Radiology Orders:  Order Name: Mr Spine Lumbar; Last Status: Ordered; Time:    04/09/17 16:10; By: Dionne Milo; For: fdf; Order Method: Electronic;    Notes: Bed Name: A18  Attending Notes:  16:18 Attestation: Assessment and care plan reviewed with             fdf        resident/midlevel provider. See their note for details.        Resident's history reviewed, patient interviewed and examined.        Attending HPI: HPI: Patient presents by ambulance from his  .  Name:Jeremy Torres, Jeremy Torres  [id number]  [id number]  Page 4 of 8  %%PAGE  .  Name: Jeremy Torres, Jeremy Torres  MRN: [id number]  Age: 47 yrs  Sex: Male  DOB: Sep 15, 1975  Arrival Date: 04/09/2017  Arrival Time: 12:12  Account#: [id number]  .  Working Diagnosis: Weakness  PCP: No Pcp, Per, PATIENT  .        hotel after he found himself unable to ambulate secondary to        bilateral leg weakness. He had what sounds like viral syndrome        symptoms starting about 10 days ago that involved sore throat,        cough, and GI symptoms. He was treated with a course of        antibiotics, amoxicillin, which he continues, and 2 days of        steroids which he finished a while ago. Yesterday he noted        moderately severe sharp low back pain  which was diffuse, at        around 5:00 in the morning he noticed that he had moderate dull        heaviness and discomfort in both legs, and he felt like he        could not bear weight. Symptoms persisted and worsened, and he        felt like his symptoms were ascending from feet to the thighs.        He finally managed to ambulate a little, but only by holding        onto walls. He denies any incontinence or retention. No fever        or chills. No abdominal pain or GI symptoms. No dyspnea or        difficulty taking a breath. He had a very mild headache earlier        without photophobia, neck pain, or stiffness. He has had        occasional tingling in his fingertips for the past couple        weeks, but that has been very mild. No clumsiness. He denies        any visual complaints. PMH as above. He is visiting from out of        state. He did not try any medication or treatment. No prior        similar history, and no history of any neurologic disease.        Attending Exam: My personal exam reveals Patient appears in no        acute distress. Vital signs are normal. NC/AT. Pupils equal        reactive to light/EOMI visual acuity is grossly intact, no        nystagmus. Neck is supple with full range of motion, nontender.        Neuro: Awake, alert, oriented,  clear speech, cranial nerves        grossly symmetric. Sensation to light touch grossly intact        throughout all 4 extremities including the saddle. Motor        strength seems diminished bilaterally in his lower extremities,        with difficulty flexing straight legs against resistance, and        quickly extinguishing attempted resisted dorsiflexion of the        great toes, left torso and right. Prepatellar deep tendon        reflexes seemed diminished but present. Gait was not tested.        Skin color and temperature are normal. Back is without gross        deformity. There is no vertebral or CVA tenderness. There was        mild lumbar soft  tissue tenderness, no tenderness at the        sciatic notch Respiratory: Lungs clr, no resp distress        Chest/axilla: No deformity, crepitus, bruising/tenderness        Cardiovascular: RRR w/o MRG, pulses strong, color nl        Abdomen/GI: soft, NT, Nl BS, no HSM or mass Psych: Pleasant,        cooperative, nl insight. I have reviewed the Nurses Notes, and        I agree. ED Course: Patient was given a small dose of Toradol  .  Name:Jeremy Torres, Jeremy Torres  [id number]  [id number]  Page 5 of 8  %%PAGE  .  Name: Jeremy Torres, Jeremy Torres  MRN: [id number]  Age: 73 yrs  Sex: Male  DOB: 30-Mar-1976  Arrival Date: 04/09/2017  Arrival Time: 12:12  Account#: [id number]  .  Working Diagnosis: Weakness  PCP: No Pcp, Per, PATIENT  .        without much relief. Given his recent viral type illness, him        what seems potentially to be an ascending bilateral lower        extremity paralysis, the patient will be observed in the        emergency department due to this diagnostic uncertainty while        we evaluate him. Attending chart complete and electronically        signed: Sharrell Ku. Zachery Conch MD, MS, Armando Gang [phone].  21:06 Transition of care: After a discussion of the patient's case,   fdf        care is transferred to Dr. Jeannett Senior.  .  ED Observation:  16:18 REFER PATIENT TO ED OBSERVATION STATUS. The patient was         fdf        informed of the need for further observational care. Diagnosis:        Acute lower extremity weakness. Reason for ED Observation: At        the time of referral to ED Observation status, a more precise        diagnosis is needed and further observation and testing is        required for diagnostic accuracy. TREATMENT PLAN: Pain Control        Serial Neurologic Exams Specialist Consultation Neurology.        Family history No immediate family members are acutely ill.        Progress Note: Patient was seen in consultation by neurology.        They think the  diagnosis of Guillain-Barr syndrome is         extremely unlikely, especially because his exam is asymmetric,        his back pain seems more prominent for them, and they were able        to get him to stand and ambulate. They think lumbar        radiculopathy is more likely etiology of his symptoms. However,        given his inability to walk earlier, coupled with the back        pain, I have ordered a lumbar MRI, they read by radiology. He        he will receive additional analgesics and muscle relaxants.  16:27 Progress Note: Labs all nl.                                     fdf  21:06 Progress Note: Cont to await MRI. Pain markedly improved after  fdf        meds but still feels very weak--able to walk short distance but        with assist.  23:08 Progress Note: 41yo M with ascending bilateral pain and         erw        weakness in LE following viral illness 1 week ago. VS wnl. On        exam, 1+ LE reflexes and 4/5 dorsiflexion and plantar flexion.        No UE or neck weakness or respiratory compromise. Lytes wnl, no        leukocytosis. CRP, ESR, CK nl. Neurology was consulted and did        not appreciate any LE weakness, no concern for GBS. S/Jeremy Torres        toradol, decadron, valium, oxycodone with improvement in pain.        No progression of symptoms since arrived in the ED. Awaiting        MRI spine results. Pt requests d/c to home with follow-up in        [place] if normal MRI. Will continue to monitor.  23:45 Progress Note: Prelim MRI results are normal. Plan to d/c to    erw  .  Name:Jeremy Torres, Jeremy Torres  [id number]  [id number]  Page 6 of 8  %%PAGE  .  Name: Jeremy Torres, Jeremy Torres  MRN: [id number]  Age: 24 yrs  Sex: Male  DOB: [birth year]  Arrival Date: 04/09/2017  Arrival Time: 12:12  Account#: [id number]  .  Working Diagnosis: Weakness  PCP: No Pcp, Per, PATIENT  .        home with early follow-up. Counseled to return if symptoms        worsen.  .  Disposition:  16:18 Recommended Level of Care: Place in Observation Status. Chart   fdf         complete.  .  Disposition Summary:  04/09/17 23:25  Discharge Ordered        Location: Home                                                  jms        Problem: new(04/09/17 23:25)  jms        Symptoms: have improved(04/09/17 23:25)                         jms        Condition: Stable(04/09/17 23:25)                               jms        Preliminary Diagnosis          - Weakness                                                    jms        Followup:                                                       jms          - With: Private Physician          - When: 3 - 5 days          - Reason:        Discharge Instructions:          - Discharge Summary Sheet                                     jms          - WEAKNESS, Unk Cause                                         jms        Forms:          - Medication Reconciliation Form                              jms  Signatures:  Lorraine Lax                      MD   fdf  Barbette Merino                          MD   jms  Dispatcher, [employer]                          dispa  Verl Dicker                         RN   jg8  Charyl Dancer                        RN   fb7  Ulyses Southward                            BSN  Margaretha Glassing                           Sec  [address]                          MD   erw  .  Corrections: (The following items were deleted from the chart)  14:08 14:07 fdf                                                       am33  23:25 14:07 Observation fdf                                           jms  23:25 14:07 Lorraine Lax fdf                                    jms  23:25 14:07 ED Obsv fdf                                               jms  23:25 14:07 Stable fdf                                                jms  .  Name:Jeremy Torres, Jeremy Torres  [id number]  [id number]  Page 7 of 8  %%PAGE  .  Name: Jeremy Torres, Jeremy Torres  MRN: [id number]  Age: 61  yrs  Sex: Male  DOB: 1975-06-28  Arrival Date: 04/09/2017  Arrival Time: 12:12  Account#: [id number]  .  Working Diagnosis: Weakness  PCP: No Pcp, Per, PATIENT  .  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  Document is preliminary until electronically or manually signed by the atte  nding physician  .  .  .  .  .  .  .  .  .  .  .  .  .  .  .  .  .  .  .  .  .  .  .  .  .  .  .  .  .  .  .  .  .  .  .  .  Name:Pousson, Jeremy Torres  [id number]  [id number]  Page 8 of 8  .  %%END

## 2017-09-29 IMAGING — CT CT ABD-PELV W/ CM
2 of 5 series · 16 of 46 positions shown, 18 images · IV contrast (omnipaque)
Comparison: CT the abdomen and pelvis 01/22/2013.

CLINICAL DATA: 39-year-old male with history of left lower quadrant
abdominal pain and abdominal distention.

EXAM:
CT ABDOMEN AND PELVIS WITH CONTRAST
TECHNIQUE: Multidetector CT imaging of the abdomen and pelvis was performed
using the standard protocol following bolus administration of
intravenous contrast.
CONTRAST:  50mL OMNIPAQUE IOHEXOL 300 MG/ML SOLN, 100mL OMNIPAQUE
IOHEXOL 300 MG/ML SOLN

[Series 2: abd/pel with · axial · 0.70mm/px · z∈[-350,+15]mm · 13 of 83 slices shown, 15 images]
[im 5/83  soft-tissue]
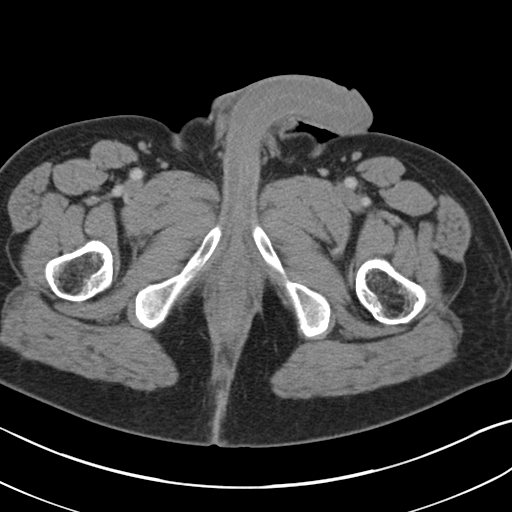
[im 5/83  bone]
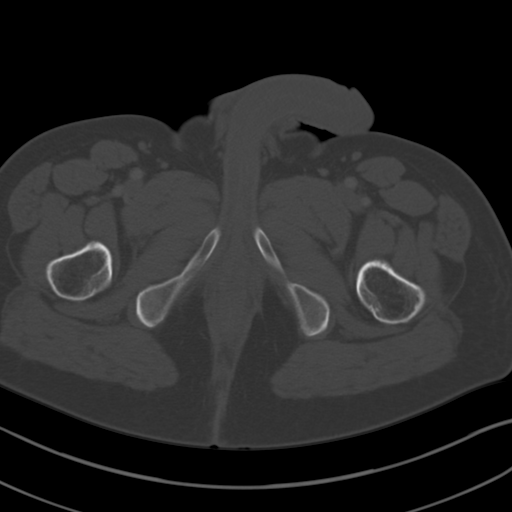
[im 10/83  soft-tissue]
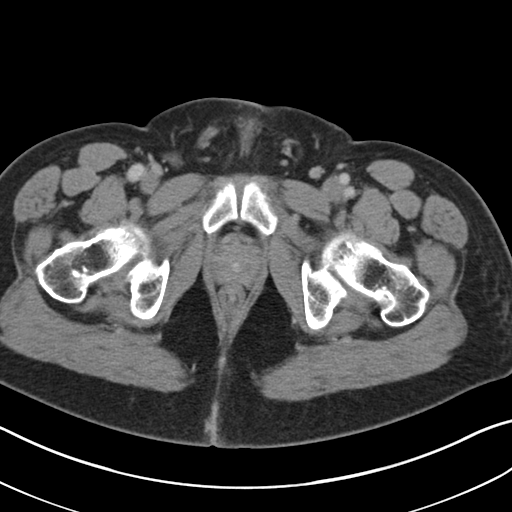
[im 20/83  soft-tissue]
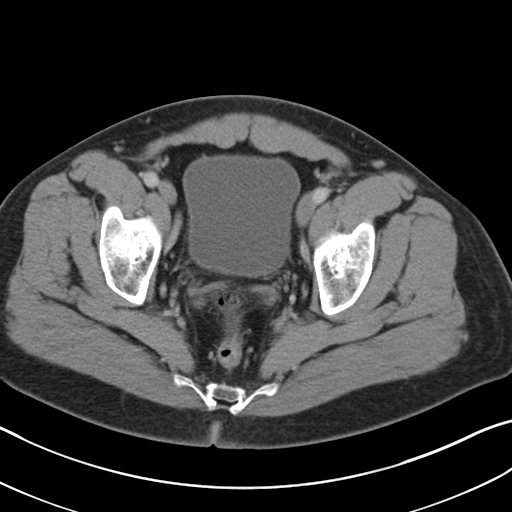
[im 25/83  soft-tissue]
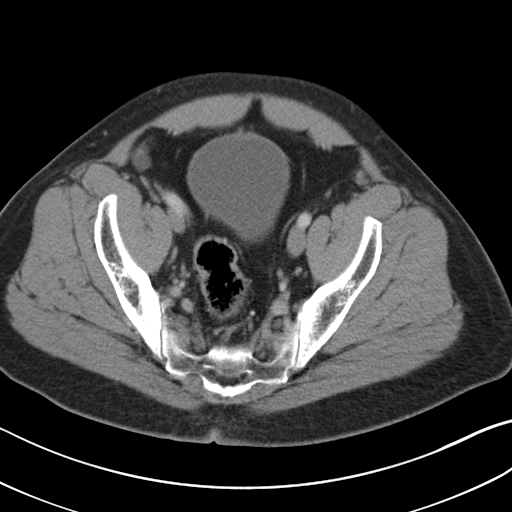
[im 29/83  soft-tissue]
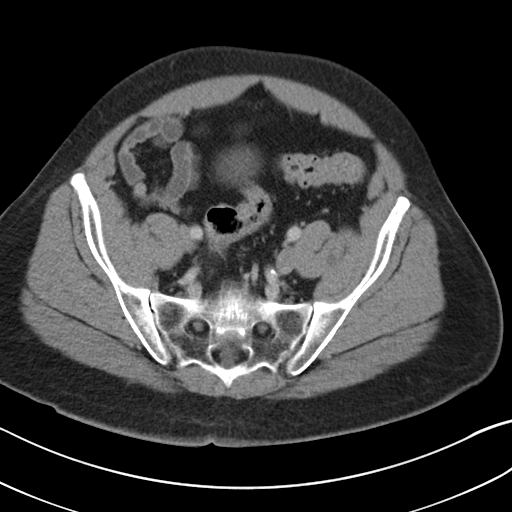
[im 34/83  soft-tissue]
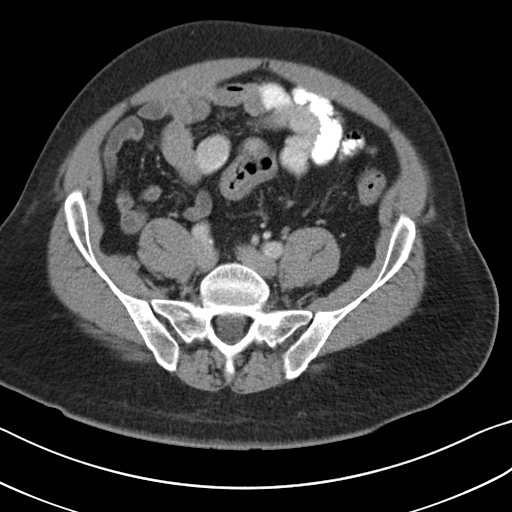
[im 44/83  soft-tissue]
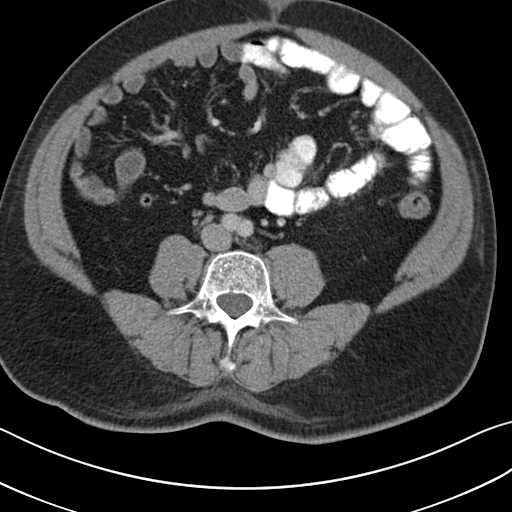
[im 49/83  soft-tissue]
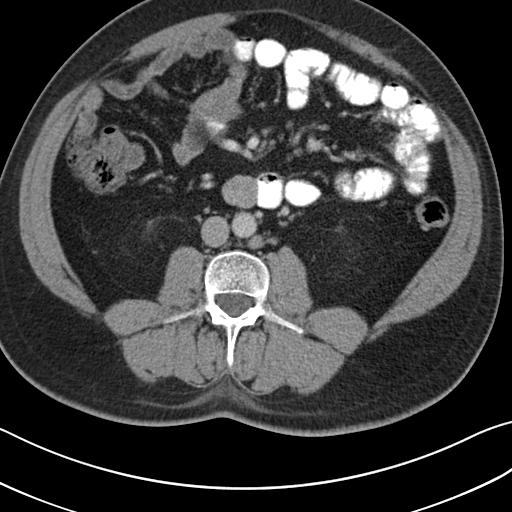
[im 54/83  soft-tissue]
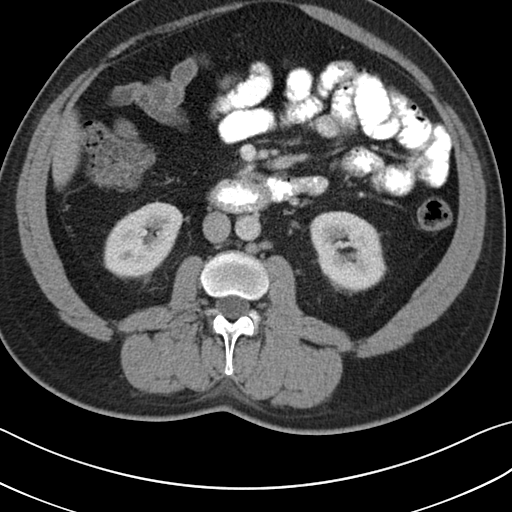
[im 54/83  bone]
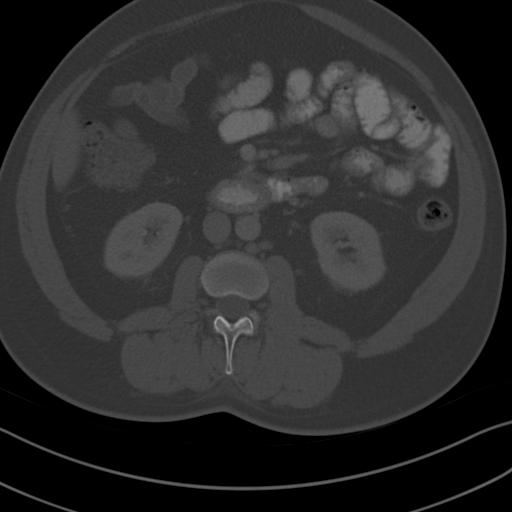
[im 58/83  soft-tissue]
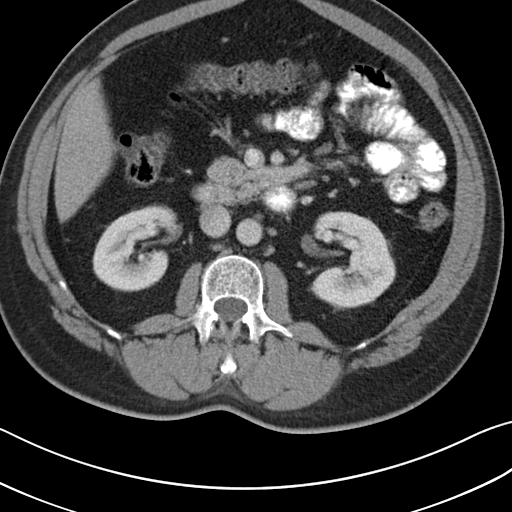
[im 63/83  soft-tissue]
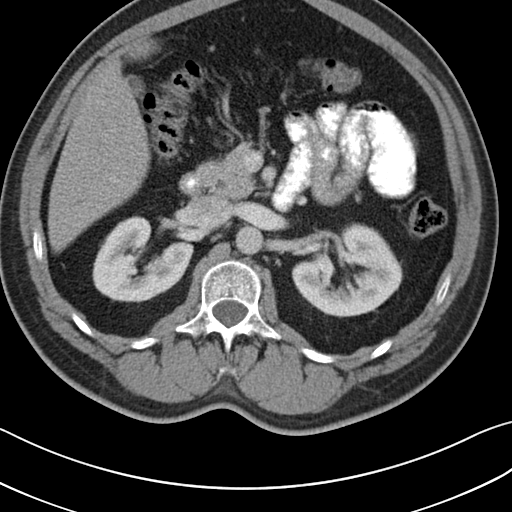
[im 73/83  soft-tissue]
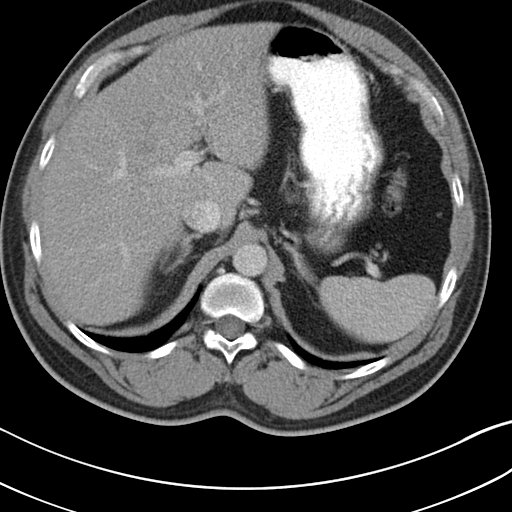
[im 78/83  soft-tissue]
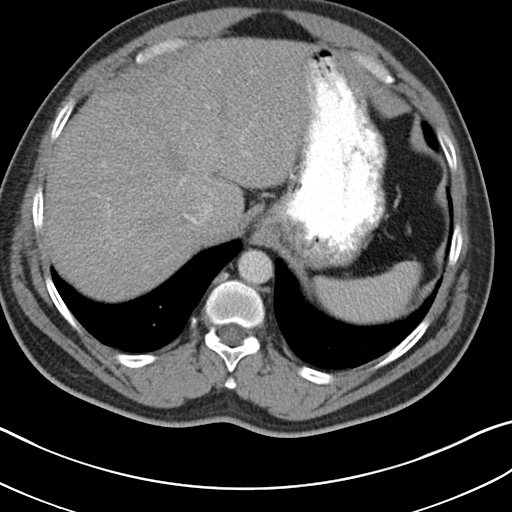

[Series 5: coronal a/|p · coronal · 0.82mm/px · 3 of 121 slices shown]
[im 41/121  soft-tissue]
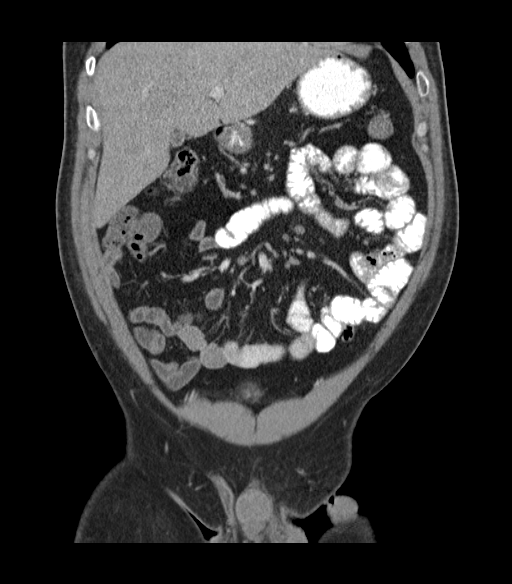
[im 54/121  soft-tissue]
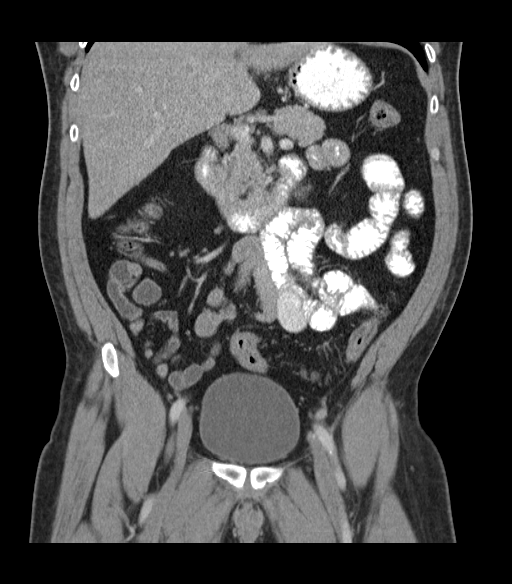
[im 67/121  soft-tissue]
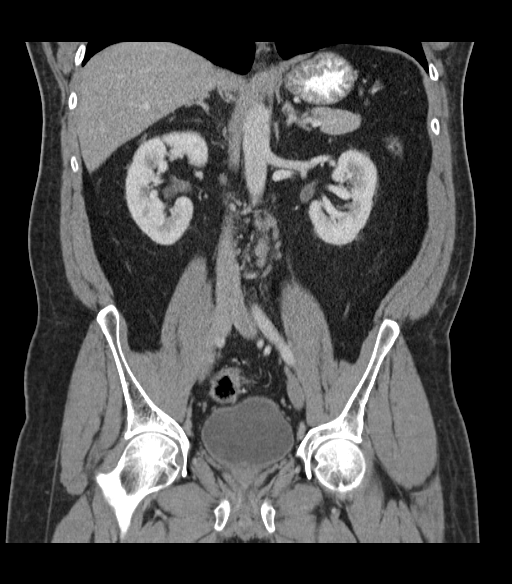

[16 of 46 positions shown; findings below may reference images not displayed]

FINDINGS: Lower chest:  Unremarkable.

Hepatobiliary: No cystic or solid hepatic lesions are noted in the
visualized portions of the liver (dome of the liver was incompletely
visualized on today's examination). No intra or extrahepatic biliary
ductal dilatation. Gallbladder is normal in appearance.

Pancreas: No pancreatic mass. No pancreatic ductal dilatation. No
pancreatic or peripancreatic fluid or inflammatory changes.

Spleen: Unremarkable.

Adrenals/Urinary Tract: Bilateral kidneys and bilateral adrenal
glands are normal in appearance. No hydroureteronephrosis or
perinephric stranding. Urinary bladder is normal in appearance.

Stomach/Bowel: Normal appearance of the stomach. No pathologic
dilatation of small bowel or colon. In knee distal descending colon
and proximal sigmoid colon there is some relative thickening of the
colonic wall with some mild hypervascularity in the associated
mesocolon. This area of the colon is relatively decompressed, such
that this may be "pseudothickening", however, the hypervascularity
could suggest mild inflammation as can be seen in the setting of
mild colitis. Normal appendix.

Vascular/Lymphatic: Atherosclerosis throughout the abdominal and
pelvic vasculature, without evidence of aneurysm or dissection.
Several borderline enlarged retroperitoneal lymph nodes are noted,
but are nonspecific. No pathologically enlarged lymph nodes are
noted in the abdomen or pelvis. Mildly enlarged right inguinal lymph
node measuring up to 1 cm in short axis.

Reproductive: Prostate gland and seminal vesicles are unremarkable
in appearance.

Other: No significant volume of ascites.  No pneumoperitoneum.

Musculoskeletal: There are no aggressive appearing lytic or blastic
lesions noted in the visualized portions of the skeleton.
IMPRESSION: 1. Findings suggestive of mild colitis in the distal descending
colon and proximal sigmoid colon, as discussed above.
2. No other potential acute findings noted in the abdomen or pelvis
to account for the patient's symptoms.
3. Normal appendix.
4. Mildly enlarged right inguinal lymph node, likely reactive.

## 2018-06-06 DIAGNOSIS — J111 Influenza due to unidentified influenza virus with other respiratory manifestations: Secondary | ICD-10-CM | POA: Insufficient documentation

## 2020-12-30 ENCOUNTER — Emergency Department (HOSPITAL_COMMUNITY): Admission: EM | Admit: 2020-12-30 | Discharge: 2020-12-30 | Discharge status: Against Medical Advice | Payer: 59

## 2020-12-30 ENCOUNTER — Other Ambulatory Visit: Payer: Self-pay

## 2021-11-07 ENCOUNTER — Ambulatory Visit (INDEPENDENT_AMBULATORY_CARE_PROVIDER_SITE_OTHER): Payer: 59 | Admitting: Podiatry

## 2021-11-07 ENCOUNTER — Encounter: Payer: Self-pay | Admitting: Podiatry

## 2021-11-07 ENCOUNTER — Ambulatory Visit (INDEPENDENT_AMBULATORY_CARE_PROVIDER_SITE_OTHER): Payer: 59

## 2021-11-07 DIAGNOSIS — M722 Plantar fascial fibromatosis: Secondary | ICD-10-CM

## 2021-11-07 MED ORDER — TRIAMCINOLONE ACETONIDE 10 MG/ML IJ SUSP
20.0000 mg | Freq: Once | INTRAMUSCULAR | Status: AC
  Administered 2021-11-07: 20 mg

## 2021-11-08 NOTE — Progress Notes (Signed)
Subjective:   Patient ID: Ethan Middleton, male   DOB: 46 y.o.   MRN: [id number]   HPI Patient presents with a lot of heel pain of both feet stating that this has been going on now for a long time but worse over the last few months and that he is very active and [place] currently in airports 4 days a [place].  Patient does smoke a half a pack of cigarettes per day and tries to be active   Review of Systems  All other systems reviewed and are negative.       Objective:  Physical Exam Vitals and nursing note reviewed.  Constitutional:      Appearance: He is well-developed.  Pulmonary:     Effort: Pulmonary effort is normal.  Musculoskeletal:        General: Normal range of motion.  Skin:    General: Skin is warm.  Neurological:     Mental Status: He is alert.     Neurovascular status intact muscle strength was found to be adequate range of motion adequate with patient found to have quite a bit of discomfort in the heel region bilateral with some enlargement of the plantar heel and swelling.  Patient does have good digital perfusion well oriented x3     Assessment:  Acute plantar fasciitis of the heel region bilateral with inflammation at the insertional point of the tendon calcaneus     Plan:  Acute plantar fasciitis bilateral present with H&P done today sterile prep and injected the fascia at insertion 3 mg Kenalog 5 mg Xylocaine and gave instructions for stretching exercises shoe gear modification.  Reappoint to recheck 2 weeks to see what else we may need to do  X-rays indicate there is small spur formation no indication stress fracture arthritis

## 2021-11-21 ENCOUNTER — Ambulatory Visit: Payer: 59 | Admitting: Podiatry

## 2022-01-08 DIAGNOSIS — I10 Essential (primary) hypertension: Secondary | ICD-10-CM | POA: Insufficient documentation

## 2022-01-08 DIAGNOSIS — K439 Ventral hernia without obstruction or gangrene: Secondary | ICD-10-CM | POA: Insufficient documentation

## 2022-03-20 ENCOUNTER — Encounter: Payer: Self-pay | Admitting: Podiatry

## 2022-03-20 ENCOUNTER — Ambulatory Visit (INDEPENDENT_AMBULATORY_CARE_PROVIDER_SITE_OTHER): Payer: 59 | Admitting: Podiatry

## 2022-03-20 DIAGNOSIS — M722 Plantar fascial fibromatosis: Secondary | ICD-10-CM

## 2022-03-20 MED ORDER — TRIAMCINOLONE ACETONIDE 10 MG/ML IJ SUSP
20.0000 mg | Freq: Once | INTRAMUSCULAR | Status: AC
  Administered 2022-03-20: 20 mg

## 2022-03-23 NOTE — Progress Notes (Signed)
Subjective:   Patient ID: Ethan Middleton, male   DOB: 46 y.o.   MRN: [id number]   HPI Patient presents with heel pain bilateral at the insertional point of the tendon into the calcaneus with fluid buildup   ROS      Objective:  Physical Exam  Neurovascular status intact inflammation pain of the plantar heel region bilateral with fluid buildup     Assessment:  Acute plantar fasciitis bilateral     Plan:  Sterile prep injected the insertional point of the plantar fascia calcaneus bilateral 3 mg Kenalog 5 mg Xylocaine advised on support reappoint to recheck

## 2022-07-01 ENCOUNTER — Ambulatory Visit: Payer: 59 | Admitting: Podiatry

## 2022-07-13 ENCOUNTER — Ambulatory Visit: Payer: 59 | Admitting: Podiatry

## 2022-07-21 ENCOUNTER — Encounter: Payer: Self-pay | Admitting: Podiatry

## 2022-07-21 ENCOUNTER — Ambulatory Visit (INDEPENDENT_AMBULATORY_CARE_PROVIDER_SITE_OTHER): Payer: 59 | Admitting: Podiatry

## 2022-07-21 DIAGNOSIS — M722 Plantar fascial fibromatosis: Secondary | ICD-10-CM

## 2022-07-21 NOTE — Patient Instructions (Signed)
Call [facility] to schedule your MRI at the below locations.  Schedule it for later than mid April. Please allow at least 1 business day after your visit to process the referral.  It may take longer depending on approval from insurance.  Please let me know if you have issues or problems scheduling the MRI   [facility] [place] [phone] [place], [place] [address]  [place] [address] [place], [place] [address]

## 2022-07-22 NOTE — Progress Notes (Signed)
  Subjective:  Patient ID: Ethan Middleton, male    DOB: Jan 09, 1976,  MRN: [id number]  Chief Complaint  Patient presents with   Plantar Fasciitis    Flare up both feet x 30 days - he is traveling more now and thinks that standing/walking in airports is what is making it worse    47 y.o. male presents with the above complaint. History confirmed with patient.  He returns for follow-up his plantar fasciitis is bothering him quite a bit.  Objective:  Physical Exam: warm, good capillary refill, no trophic changes or ulcerative lesions, normal DP and PT pulses, and normal sensory exam. Left Foot: point tenderness over the heel pad and gastrocnemius equinus is noted with a positive silverskiold test Right Foot: point tenderness over the heel pad and gastrocnemius equinus is noted with a positive silverskiold test  Previous bilateral foot x-rays dated 11/10/2021 show mild pes planus deformity, no evidence of fracture dislocation degenerative changes or plantar spurring. Assessment:   1. Plantar fasciitis      Plan:  Patient was evaluated and treated and all questions answered.  We discussed his ongoing plantar fasciitis which has been chronic in happening for several years at this point.  We discussed further nonoperative treatment.  He has had success somewhat with injection therapy but this continues to wax and wane in return.  He would like more definitive long-term treatment.  I recommended we evaluate the possibility of surgical correction.  I recommend an MRI prior to proceeding with this.  MRI for both heels was ordered.  So far he has had treatment since 2018, most recently since July of last year and has not improved despite home physical therapy, NSAIDs and multiple injections.   After sterile prep with povidone-iodine solution and alcohol, the bilateral heel was injected with 0.5cc 2% xylocaine plain, 0.5cc 0.5% marcaine plain, 5mg  triamcinolone acetonide, and 2mg  dexamethasone was  injected along the medial plantar fascia at the insertion on the plantar calcaneus. The patient tolerated the procedure well without complication.  Return for after MRI to review.

## 2022-08-09 ENCOUNTER — Other Ambulatory Visit: Payer: 59

## 2022-09-29 ENCOUNTER — Encounter: Payer: Self-pay | Admitting: Podiatry

## 2022-10-03 ENCOUNTER — Ambulatory Visit
Admission: RE | Admit: 2022-10-03 | Discharge: 2022-10-03 | Disposition: A | Discharge status: Home / Self Care | Payer: 59 | Source: Ambulatory Visit | Attending: Podiatry | Admitting: Podiatry

## 2022-10-03 DIAGNOSIS — M722 Plantar fascial fibromatosis: Secondary | ICD-10-CM

## 2022-10-07 ENCOUNTER — Telehealth: Payer: Self-pay | Admitting: Podiatry

## 2022-10-07 NOTE — Telephone Encounter (Signed)
Pt called asking if you got the mri results and if he would qualify for surgery. He scheduled a follow up appt with Dr Charlsie Merles for 6.10.2024 but you were ordering provider of the mri.

## 2022-10-07 NOTE — Telephone Encounter (Signed)
Left message for pt per Dr Lilian Kapur the imaging place is taking a little longer to read the reports 4 to 7 days so hopefully the results will be back for his appt on 6.10. with Dr Charlsie Merles

## 2022-10-12 ENCOUNTER — Encounter: Payer: Self-pay | Admitting: Podiatry

## 2022-10-12 ENCOUNTER — Ambulatory Visit (INDEPENDENT_AMBULATORY_CARE_PROVIDER_SITE_OTHER): Payer: 59 | Admitting: Podiatry

## 2022-10-12 DIAGNOSIS — M722 Plantar fascial fibromatosis: Secondary | ICD-10-CM

## 2022-10-12 NOTE — Progress Notes (Signed)
Subjective:   Patient ID: Ethan Middleton, male   DOB: 47 y.o.   MRN: [id number]   HPI Patient presents stating that the heels continue to be unrelenting pain and he wants to go ahead and have surgery.  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  After reviewing all different options he has opted for surgery and I have recommended endoscopic release of the medial band explaining procedure and risk and patient is willing to have this done understands all complications as outlined and consent form and the fact that arch pain may occur for a while other foot pains or Achilles may take time to settle down.  Left 1 to be done first patient was scheduled and encouraged to call questions concerns with air fracture walker dispensed today with instructions on usage and fitted properly to the lower leg

## 2022-10-15 ENCOUNTER — Telehealth: Payer: Self-pay | Admitting: Urology

## 2022-10-15 NOTE — Telephone Encounter (Signed)
DOS - 10/27/22  EPF LEFT --- [address]  [employer] EFFECTIVE DATE - 01/19/20  DEDUCTIBLE -  $4,200.00 W/ $0.00 REMAINING OOP - $5,750.00 W/ $5,118.30 REMAINING COINSURANCE - 30%   PER [employer] WEBSITE FOR CPT CODE 60454 HAS BEEN APPROVED, AUTH # [id number], GOOD FROM 10/27/22 - 01/25/23.

## 2022-10-26 MED ORDER — HYDROCODONE-ACETAMINOPHEN 10-325 MG PO TABS: 1.0000 | ORAL_TABLET | Freq: Three times a day (TID) | ORAL | 0 refills | Status: AC | PRN

## 2022-10-26 NOTE — Addendum Note (Signed)
Addended by: Lenn Sink on: 10/26/2022 05:09 PM   Modules accepted: Orders

## 2022-10-27 ENCOUNTER — Encounter: Payer: Self-pay | Admitting: Podiatry

## 2022-10-27 DIAGNOSIS — M722 Plantar fascial fibromatosis: Secondary | ICD-10-CM | POA: Diagnosis not present

## 2022-11-02 ENCOUNTER — Ambulatory Visit (INDEPENDENT_AMBULATORY_CARE_PROVIDER_SITE_OTHER): Payer: 59 | Admitting: Podiatry

## 2022-11-02 ENCOUNTER — Encounter: Payer: Self-pay | Admitting: Podiatry

## 2022-11-02 ENCOUNTER — Other Ambulatory Visit: Payer: Self-pay | Admitting: Podiatry

## 2022-11-02 ENCOUNTER — Ambulatory Visit (INDEPENDENT_AMBULATORY_CARE_PROVIDER_SITE_OTHER): Payer: 59

## 2022-11-02 DIAGNOSIS — M722 Plantar fascial fibromatosis: Secondary | ICD-10-CM

## 2022-11-02 DIAGNOSIS — Z9889 Other specified postprocedural states: Secondary | ICD-10-CM | POA: Diagnosis not present

## 2022-11-02 MED ORDER — HYDROCODONE-ACETAMINOPHEN 10-325 MG PO TABS: 1.0000 | ORAL_TABLET | Freq: Three times a day (TID) | ORAL | 0 refills | Status: AC | PRN

## 2022-11-02 NOTE — Progress Notes (Signed)
Subjective:   Patient ID: Ethan Middleton, male   DOB: 47 y.o.   MRN: [id number]   HPI Patient states doing well with surgery so far minimal discomfort still using crutches   ROS      Objective:  Physical Exam  Neurovascular status intact with negative Denna Haggard' sign noted with patient found to have excellent healing of incision sites medial and lateral side of the left foot with wound edges well coapted     Assessment:  Doing well post endoscopic surgery left     Plan:  H&P reviewed and went ahead and reapplied sterile dressing continue elevation compression continued boot and also dispensed surgical shoe that he may start with.  Reappoint 2 weeks suture removal earlier if needed

## 2022-11-13 ENCOUNTER — Encounter: Payer: Self-pay | Admitting: Podiatry

## 2022-11-13 ENCOUNTER — Ambulatory Visit (INDEPENDENT_AMBULATORY_CARE_PROVIDER_SITE_OTHER): Payer: 59 | Admitting: Podiatry

## 2022-11-13 DIAGNOSIS — M722 Plantar fascial fibromatosis: Secondary | ICD-10-CM | POA: Diagnosis not present

## 2022-11-15 NOTE — Progress Notes (Signed)
Subjective:   Patient ID: Ethan Middleton, male   DOB: 47 y.o.   MRN: [id number]   HPI Patient states doing excellent with surgery   ROS      Objective:  Physical Exam  Neurovascular status intact negative Denna Haggard' sign was noted with patient found to have significant improvement in pain plantar left incision sites intact wound edges well coapted     Assessment:  Doing well post endoscopic surgery left     Plan:  Reviewed condition recommended the continuation of conservative care boot usage as needed but may return to tennis shoes with stitches removed today and wound edges well coapted applied Band-Aids and ankle compression stocking

## 2022-11-16 ENCOUNTER — Encounter: Payer: 59 | Admitting: Podiatry

## 2023-06-04 ENCOUNTER — Ambulatory Visit: Payer: Self-pay | Admitting: Podiatry

## 2023-10-18 ENCOUNTER — Ambulatory Visit: Payer: Self-pay | Admitting: Family Medicine

## 2023-10-25 ENCOUNTER — Ambulatory Visit: Payer: Self-pay | Admitting: Family Medicine

## 2023-10-27 ENCOUNTER — Ambulatory Visit: Payer: Self-pay | Admitting: Family Medicine

## 2023-10-27 ENCOUNTER — Encounter: Payer: Self-pay | Admitting: Family Medicine

## 2023-11-22 ENCOUNTER — Encounter: Payer: Self-pay | Admitting: Nurse Practitioner

## 2023-11-22 ENCOUNTER — Ambulatory Visit (INDEPENDENT_AMBULATORY_CARE_PROVIDER_SITE_OTHER): Payer: Self-pay | Admitting: Nurse Practitioner

## 2023-11-22 VITALS — BP 100/60 | HR 75 | Temp 98.6°F | Ht 67.0 in | Wt 209.8 lb

## 2023-11-22 DIAGNOSIS — E66811 Obesity, class 1: Secondary | ICD-10-CM

## 2023-11-22 DIAGNOSIS — I10 Essential (primary) hypertension: Secondary | ICD-10-CM | POA: Diagnosis not present

## 2023-11-22 DIAGNOSIS — Z136 Encounter for screening for cardiovascular disorders: Secondary | ICD-10-CM

## 2023-11-22 DIAGNOSIS — Z8659 Personal history of other mental and behavioral disorders: Secondary | ICD-10-CM | POA: Insufficient documentation

## 2023-11-22 DIAGNOSIS — E6609 Other obesity due to excess calories: Secondary | ICD-10-CM | POA: Insufficient documentation

## 2023-11-22 DIAGNOSIS — Z7689 Persons encountering health services in other specified circumstances: Secondary | ICD-10-CM

## 2023-11-22 DIAGNOSIS — G4733 Obstructive sleep apnea (adult) (pediatric): Secondary | ICD-10-CM | POA: Diagnosis not present

## 2023-11-22 DIAGNOSIS — Z139 Encounter for screening, unspecified: Secondary | ICD-10-CM

## 2023-11-22 DIAGNOSIS — K900 Celiac disease: Secondary | ICD-10-CM

## 2023-11-22 DIAGNOSIS — F5102 Adjustment insomnia: Secondary | ICD-10-CM | POA: Diagnosis not present

## 2023-11-22 DIAGNOSIS — Z13220 Encounter for screening for lipoid disorders: Secondary | ICD-10-CM

## 2023-11-22 DIAGNOSIS — Z1159 Encounter for screening for other viral diseases: Secondary | ICD-10-CM

## 2023-11-22 DIAGNOSIS — Z2821 Immunization not carried out because of patient refusal: Secondary | ICD-10-CM

## 2023-11-22 DIAGNOSIS — E559 Vitamin D deficiency, unspecified: Secondary | ICD-10-CM

## 2023-11-22 DIAGNOSIS — Z6832 Body mass index (BMI) 32.0-32.9, adult: Secondary | ICD-10-CM

## 2023-11-22 MED ORDER — VALSARTAN 320 MG PO TABS: 320.0000 mg | ORAL_TABLET | Freq: Every day | ORAL | 1 refills | Status: DC

## 2023-11-22 MED ORDER — ESCITALOPRAM OXALATE 10 MG PO TABS: 10.0000 mg | ORAL_TABLET | Freq: Every day | ORAL | 1 refills | Status: DC

## 2023-11-22 MED ORDER — HYDROCHLOROTHIAZIDE 25 MG PO TABS: 25.0000 mg | ORAL_TABLET | Freq: Every day | ORAL | 1 refills | Status: DC

## 2023-11-22 MED ORDER — ZOLPIDEM TARTRATE ER 12.5 MG PO TBCR
12.5000 mg | EXTENDED_RELEASE_TABLET | Freq: Every evening | ORAL | 5 refills | Status: AC | PRN
Start: 2023-11-22 — End: ?

## 2023-11-22 NOTE — Patient Instructions (Signed)
 You can take the restora daily to help with your GI issues and see how this works Check blood pressure daily and keep a log we can discuss at next visit if we can decrease or stop any blood pressure medications

## 2023-11-22 NOTE — Assessment & Plan Note (Signed)
 He is encouraged to strive for BMI less than 30 to decrease cardiac risk. Advised to aim for at least 150 minutes of exercise per week.

## 2023-11-22 NOTE — Assessment & Plan Note (Signed)
 Sleep apnea with previous CPAP use, discontinued for several years. Weight gain may contribute to symptoms. Discussed importance of weight management and potential need for reevaluation. He may need a new sleep study last noted in 2015.

## 2023-11-22 NOTE — Progress Notes (Signed)
 LILLETTE Kristeen JINNY Gladis, CMA,acting as a Neurosurgeon for [name], FNP.,have documented all relevant documentation on the behalf of [name], FNP,as directed by  [name], FNP while in the presence of [name], FNP.  Subjective:  Patient ID: Ethan Middleton , male    DOB: 16-Jan-1976 , 48 y.o.   MRN: [id number]  Chief Complaint  Patient presents with   Establish Care    Patient presents today to establish care patient reports compliance with medication. Patient denies any chest pain, SOB, or headaches. He reports his wife had a bad experience with his old PCP so they are moving PCPs. He was previously going to [facility].     Hypertension    Patient would like to have his hypertension evaluated. He doesn't check check his BP readings at home but like to know if his BP medications are the right ones for him.     HPI Discussed the use of AI scribe software for clinical note transcription with the patient, who gave verbal consent to proceed.  History of Present Illness Ethan Middleton is a 48 year old male with hypertension, sleep apnea, celiac disease, and insomnia who presents for medication management and follow-up. He had been seeing a provider at [facility] up until 2 months ago.  He has a history of hypertension, which he attributes to stress related to one of his children. He has been on blood pressure medication for approximately six years, currently taking amlodipine 10 mg, hydrochlorothiazide  25 mg, and valsartan  320 mg. He reports low blood pressure readings at home and questions the necessity of all his medications. No chest pain or shortness of breath reported.  He has sleep apnea and insomnia. His CPAP machine has been non-functional for four to five years. He has gained weight due to poor eating habits and lack of exercise. He has tried Lunesta and Ambien  for insomnia, finding Ambien  more effective for quick sleep onset. He is currently out of both medications.  His frequent travel for work impacts his sleep routine.  He was diagnosed with celiac disease after moving to [place] , confirmed by an allergy test. He occasionally consumes gluten, experiencing a 'little burn' but stopped taking omeprazole three weeks ago without noticing any change in symptoms. He has not observed any significant difference since stopping the medication.  He has a history of plantar fasciitis and underwent surgery at a [facility] facility, with healing occurring at home.  He has depression, managed with escitalopram  since 2022, due to work-related stress. He is in good spirits but acknowledges the stress associated with his work and travel.  He reports a history of vitamin D  deficiency and has not had recent blood work done in the past three months.  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 He plans to start a 15-minute daily exercise program to improve his fitness. He is married, has three children who do not live locally, and moved from [place]  to [place] four years ago.    Past Medical History:  Diagnosis Date   Celiac disease    Depression 2018   Lexapro  has helped   GERD (gastroesophageal reflux disease) 2018   Omeprazole seems to create bloating   Hypertension    I want to check to see if I still need medication   Insomnia    Internal hemorrhoids    OSA (obstructive sleep apnea)    Plantar fasciitis  Sleep apnea      Family History  Problem Relation Age of Onset   Arthritis Maternal Uncle    Asthma Maternal Uncle    Diabetes Maternal Grandmother    Asthma Maternal Grandmother    Arthritis Maternal Grandmother    Colon polyps Neg Hx    Stomach cancer Neg Hx      Current Outpatient Medications:    amLODipine (NORVASC) 10 MG tablet, Take 10 mg by mouth daily., Disp: , Rfl:    escitalopram  (LEXAPRO ) 10 MG tablet, Take 1 tablet (10 mg total) by mouth daily., Disp:  90 tablet, Rfl: 1   hydrochlorothiazide  (HYDRODIURIL ) 25 MG tablet, Take 1 tablet (25 mg total) by mouth daily., Disp: 90 tablet, Rfl: 1   valsartan  (DIOVAN ) 320 MG tablet, Take 1 tablet (320 mg total) by mouth daily., Disp: 90 tablet, Rfl: 1   zolpidem  (AMBIEN  CR) 12.5 MG CR tablet, Take 1 tablet (12.5 mg total) by mouth at bedtime as needed., Disp: 30 tablet, Rfl: 5   Allergies  Allergen Reactions   Gluten Meal     Celiac Disease     Review of Systems  Constitutional: Negative.   Respiratory: Negative.    Cardiovascular: Negative.   Neurological: Negative.   Psychiatric/Behavioral: Negative.       Today's Vitals   11/22/23 0844  BP: 100/60  Pulse: 75  Temp: 98.6 F (37 C)  TempSrc: Oral  Weight: 209 lb 12.8 oz (95.2 kg)  Height: 5' 7 (1.702 m)  PainSc: 0-No pain   Body mass index is 32.86 kg/m.  Wt Readings from Last 3 Encounters:  11/22/23 209 lb 12.8 oz (95.2 kg)  08/30/16 202 lb (91.6 kg)  07/06/16 200 lb (90.7 kg)     Objective:  Physical Exam Vitals and nursing note reviewed.  Constitutional:      General: He is not in acute distress.    Appearance: Normal appearance. He is obese.  Cardiovascular:     Rate and Rhythm: Normal rate and regular rhythm.     Pulses: Normal pulses.     Heart sounds: Normal heart sounds. No murmur heard. Pulmonary:     Effort: Pulmonary effort is normal. No respiratory distress.     Breath sounds: Normal breath sounds. No wheezing.  Skin:    General: Skin is warm and dry.     Capillary Refill: Capillary refill takes less than 2 seconds.  Neurological:     General: No focal deficit present.     Mental Status: He is alert and oriented to person, place, and time.     Cranial Nerves: No cranial nerve deficit.     Motor: No weakness.      Assessment And Plan:  Establishing care with new doctor, encounter for -     CBC  Primary hypertension Assessment & Plan: Long-standing hypertension managed with amlodipine,  hydrochlorothiazide , and valsartan . Home blood pressure readings needed to assess control. - Instructed to keep a daily log of blood pressure readings. - Consider tapering hydrochlorothiazide  at next visit if blood pressure is well-controlled and no edema is present.  Orders: -     BMP8+eGFR -     hydroCHLOROthiazide ; Take 1 tablet (25 mg total) by mouth daily.  Dispense: 90 tablet; Refill: 1 -     Valsartan ; Take 1 tablet (320 mg total) by mouth daily.  Dispense: 90 tablet; Refill: 1  Moderate obstructive sleep apnea Assessment & Plan: Sleep apnea with previous CPAP use, discontinued for several years. Weight  gain may contribute to symptoms. Discussed importance of weight management and potential need for reevaluation. He may need a new sleep study last noted in 2015.    Vitamin D  insufficiency -     VITAMIN D  25 Hydroxy (Vit-D Deficiency, Fractures)  Adjustment insomnia Assessment & Plan: Chronic insomnia exacerbated by travel and work schedule. Previously on Ambien  and Lunesta, with Ambien  more effective. Discussed risks of dependence and potential transition to Lunesta or other options. - Refill Ambien  prescription. - Discuss potential transition to Lunesta or other options in future visits.  Orders: -     Zolpidem  Tartrate ER; Take 1 tablet (12.5 mg total) by mouth at bedtime as needed.  Dispense: 30 tablet; Refill: 5  COVID-19 vaccination declined  Tetanus, diphtheria, and acellular pertussis (Tdap) vaccination declined  Class 1 obesity due to excess calories with body mass index (BMI) of 32.0 to 32.9 in adult, unspecified whether serious comorbidity present Assessment & Plan: He is encouraged to strive for BMI less than 30 to decrease cardiac risk. Advised to aim for at least 150 minutes of exercise per week.    History of depression Assessment & Plan: Depression managed with escitalopram . Currently in good spirits but acknowledges work-related stress. Hesitant to  discontinue medication. - Continue escitalopram  as prescribed.   Encounter for lipid screening for cardiovascular disease -     Lipid panel  Encounter for hepatitis C screening test for low risk patient -     Hepatitis C antibody  Encounter for screening -     Hepatitis B surface antibody,qualitative  Celiac disease Assessment & Plan: Celiac disease. Occasional gastrointestinal discomfort. Discussed potential benefits of probiotics. - Discontinue omeprazole. - Start probiotic supplementation.   Other orders -     Escitalopram  Oxalate; Take 1 tablet (10 mg total) by mouth daily.  Dispense: 90 tablet; Refill: 1    Return in about 5 months (around 04/23/2024) for phy when able.  Patient was given opportunity to ask questions. Patient verbalized understanding of the plan and was able to repeat key elements of the plan. All questions were answered to their satisfaction.    LILLETTE [name], FNP, have reviewed all documentation for this visit. The documentation on 11/22/23 for the exam, diagnosis, procedures, and orders are all accurate and complete.   IF YOU HAVE BEEN REFERRED TO A SPECIALIST, IT MAY TAKE 1-2 WEEKS TO SCHEDULE/PROCESS THE REFERRAL. IF YOU HAVE NOT HEARD FROM US /SPECIALIST IN TWO WEEKS, PLEASE GIVE US  A CALL AT [phone].

## 2023-11-22 NOTE — Assessment & Plan Note (Signed)
 Long-standing hypertension managed with amlodipine, hydrochlorothiazide , and valsartan . Home blood pressure readings needed to assess control. - Instructed to keep a daily log of blood pressure readings. - Consider tapering hydrochlorothiazide  at next visit if blood pressure is well-controlled and no edema is present.

## 2023-11-22 NOTE — Assessment & Plan Note (Signed)
 Depression managed with escitalopram . Currently in good spirits but acknowledges work-related stress. Hesitant to discontinue medication. - Continue escitalopram  as prescribed.

## 2023-11-22 NOTE — Assessment & Plan Note (Signed)
 Chronic insomnia exacerbated by travel and work schedule. Previously on Ambien  and Lunesta, with Ambien  more effective. Discussed risks of dependence and potential transition to Lunesta or other options. - Refill Ambien  prescription. - Discuss potential transition to Lunesta or other options in future visits.

## 2023-11-22 NOTE — Assessment & Plan Note (Signed)
 Celiac disease. Occasional gastrointestinal discomfort. Discussed potential benefits of probiotics. - Discontinue omeprazole. - Start probiotic supplementation.

## 2023-11-23 ENCOUNTER — Ambulatory Visit: Payer: Self-pay | Admitting: Nurse Practitioner

## 2023-11-23 LAB — VITAMIN D 25 HYDROXY (VIT D DEFICIENCY, FRACTURES): Vit D, 25-Hydroxy: 33.8 ng/mL (ref 30.0–100.0)

## 2023-11-23 LAB — CBC
Hematocrit: 46.5 % (ref 37.5–51.0)
Hemoglobin: 14.8 g/dL (ref 13.0–17.7)
MCH: 27.2 pg (ref 26.6–33.0)
MCHC: 31.8 g/dL (ref 31.5–35.7)
MCV: 86 fL (ref 79–97)
Platelets: 343 x10E3/uL (ref 150–450)
RBC: 5.44 x10E6/uL (ref 4.14–5.80)
RDW: 13.7 % (ref 11.6–15.4)
WBC: 4.7 x10E3/uL (ref 3.4–10.8)

## 2023-11-23 LAB — BMP8+EGFR
BUN/Creatinine Ratio: 10 (ref 9–20)
BUN: 8 mg/dL (ref 6–24)
CO2: 24 mmol/L (ref 20–29)
Calcium: 9.7 mg/dL (ref 8.7–10.2)
Chloride: 98 mmol/L (ref 96–106)
Creatinine, Ser: 0.79 mg/dL (ref 0.76–1.27)
Glucose: 108 mg/dL — ABNORMAL HIGH (ref 70–99)
Potassium: 4.4 mmol/L (ref 3.5–5.2)
Sodium: 138 mmol/L (ref 134–144)
eGFR: 109 mL/min/1.73 (ref >59)

## 2023-11-23 LAB — LIPID PANEL
Chol/HDL Ratio: 4.4 ratio (ref 0.0–5.0)
Cholesterol, Total: 162 mg/dL (ref 100–199)
HDL: 37 mg/dL — ABNORMAL LOW (ref >39)
LDL Chol Calc (NIH): 113 mg/dL — ABNORMAL HIGH (ref 0–99)
Triglycerides: 61 mg/dL (ref 0–149)
VLDL Cholesterol Cal: 12 mg/dL (ref 5–40)

## 2023-11-23 LAB — HEPATITIS B SURFACE ANTIBODY,QUALITATIVE: Hep B Surface Ab, Qual: NONREACTIVE

## 2023-11-23 LAB — HEPATITIS C ANTIBODY: Hep C Virus Ab: NONREACTIVE

## 2023-11-26 ENCOUNTER — Encounter: Payer: Self-pay | Admitting: Nurse Practitioner

## 2023-11-29 ENCOUNTER — Other Ambulatory Visit: Payer: Self-pay | Admitting: Nurse Practitioner

## 2023-11-29 DIAGNOSIS — G4733 Obstructive sleep apnea (adult) (pediatric): Secondary | ICD-10-CM

## 2023-12-03 ENCOUNTER — Ambulatory Visit

## 2023-12-03 ENCOUNTER — Ambulatory Visit: Admitting: Podiatry

## 2023-12-03 DIAGNOSIS — M7671 Peroneal tendinitis, right leg: Secondary | ICD-10-CM | POA: Diagnosis not present

## 2023-12-03 DIAGNOSIS — M722 Plantar fascial fibromatosis: Secondary | ICD-10-CM | POA: Diagnosis not present

## 2023-12-03 MED ORDER — TRIAMCINOLONE ACETONIDE 10 MG/ML IJ SUSP
10.0000 mg | Freq: Once | INTRAMUSCULAR | Status: AC
  Administered 2023-12-03: 10 mg via INTRA_ARTICULAR

## 2023-12-03 MED ORDER — DICLOFENAC SODIUM 75 MG PO TBEC: 75.0000 mg | DELAYED_RELEASE_TABLET | Freq: Two times a day (BID) | ORAL | 2 refills | Status: DC

## 2023-12-04 NOTE — Progress Notes (Signed)
 Subjective:   Patient ID: Ethan Middleton, male   DOB: 48 y.o.   MRN: [id number]   HPI Patient presents with a lot of pain on the outside of the right foot and states it has been present for around a month and does not remember specific injury.  He has been limping because of this   ROS      Objective:  Physical Exam  Neurovascular status intact with patient found to have inflammation and pain of the peroneal insertion right with fluid buildup around the area and exquisite discomfort when I palpated this with no indication of muscle tendon damage     Assessment:  Appears to be acute tendinitis of the peroneal insertion base of fifth metatarsal right     Plan:  H&P reviewed and at this point I did discuss injection and risk and he wants to do this I did sterile prep I injected the insertion of the tendon into the base of the fifth metatarsal 3 mg dexamethasone  Kenalog  5 mg Xylocaine into the sheath and I applied fascial brace to lift up the lateral side of the foot and I instructed on ice and support shoe gear usage  X-rays were negative for signs of fracture or bony contusion in this area

## 2023-12-30 ENCOUNTER — Institutional Professional Consult (permissible substitution): Admitting: Neurology

## 2023-12-30 ENCOUNTER — Encounter: Payer: Self-pay | Admitting: Neurology

## 2024-02-21 ENCOUNTER — Other Ambulatory Visit: Payer: Self-pay | Admitting: Podiatry

## 2024-02-23 ENCOUNTER — Ambulatory Visit: Admitting: Podiatry

## 2024-03-06 ENCOUNTER — Other Ambulatory Visit: Payer: Self-pay | Admitting: Nurse Practitioner

## 2024-03-20 ENCOUNTER — Telehealth: Payer: Self-pay | Admitting: Neurology

## 2024-03-20 ENCOUNTER — Ambulatory Visit: Admitting: Podiatry

## 2024-03-20 NOTE — Telephone Encounter (Signed)
 Pt called and r/s appointment

## 2024-03-22 ENCOUNTER — Encounter: Payer: Self-pay | Admitting: Podiatry

## 2024-03-22 ENCOUNTER — Ambulatory Visit (INDEPENDENT_AMBULATORY_CARE_PROVIDER_SITE_OTHER): Admitting: Podiatry

## 2024-03-22 ENCOUNTER — Ambulatory Visit (INDEPENDENT_AMBULATORY_CARE_PROVIDER_SITE_OTHER)

## 2024-03-22 DIAGNOSIS — M7752 Other enthesopathy of left foot: Secondary | ICD-10-CM | POA: Diagnosis not present

## 2024-03-22 DIAGNOSIS — R2241 Localized swelling, mass and lump, right lower limb: Secondary | ICD-10-CM

## 2024-03-22 DIAGNOSIS — M779 Enthesopathy, unspecified: Secondary | ICD-10-CM | POA: Diagnosis not present

## 2024-03-22 DIAGNOSIS — M722 Plantar fascial fibromatosis: Secondary | ICD-10-CM

## 2024-03-22 MED ORDER — TRIAMCINOLONE ACETONIDE 10 MG/ML IJ SUSP
10.0000 mg | Freq: Once | INTRAMUSCULAR | Status: AC
  Administered 2024-03-22: 10 mg via INTRA_ARTICULAR

## 2024-03-22 NOTE — Progress Notes (Signed)
 Subjective:   Patient ID: Emilio Piety, male   DOB: 48 y.o.   MRN: [id number]   HPI Patient presents concerned about pain in the left top of the foot and side of the foot and on the right overall well but concerned because this developed some inflammation around the outside and may have had injury to the area   ROS      Objective:  Physical Exam  Neurovascular status intact with inflammation dorsal tendon complex left localized in nature into the lateral side of the left midfoot and on the right there is some discoloration the lateral foot is localized mild edema no other pathology noted     Assessment:  Dorsal tendinitis left with inflammation fluid buildup with discoloration right     Plan:  H&P reviewed both conditions Emina focused on the inflamed left and I did inject the tendon complex after explaining risk and into midtarsal joint 3 mg dexamethasone  Kenalog  5 mg Xylocaine.  Patient be seen back to recheck  X-rays were negative for signs of fracture or arthritic condition appears to be soft tissue

## 2024-03-29 ENCOUNTER — Ambulatory Visit: Admitting: Orthopedic Surgery

## 2024-04-10 ENCOUNTER — Encounter: Payer: Self-pay | Admitting: Internal Medicine

## 2024-04-10 ENCOUNTER — Ambulatory Visit: Admitting: Internal Medicine

## 2024-04-10 VITALS — BP 118/80 | HR 86 | Temp 98.3°F | Ht 67.0 in | Wt 206.8 lb

## 2024-04-10 DIAGNOSIS — E785 Hyperlipidemia, unspecified: Secondary | ICD-10-CM | POA: Insufficient documentation

## 2024-04-10 DIAGNOSIS — E66811 Obesity, class 1: Secondary | ICD-10-CM

## 2024-04-10 DIAGNOSIS — Z6832 Body mass index (BMI) 32.0-32.9, adult: Secondary | ICD-10-CM

## 2024-04-10 DIAGNOSIS — E6609 Other obesity due to excess calories: Secondary | ICD-10-CM | POA: Diagnosis not present

## 2024-04-10 DIAGNOSIS — I10 Essential (primary) hypertension: Secondary | ICD-10-CM | POA: Diagnosis not present

## 2024-04-10 LAB — CMP14+EGFR
ALT: 17 IU/L (ref 0–44)
AST: 12 IU/L (ref 0–40)
Albumin: 4.8 g/dL (ref 4.1–5.1)
Alkaline Phosphatase: 67 IU/L (ref 47–123)
BUN/Creatinine Ratio: 10 (ref 9–20)
BUN: 8 mg/dL (ref 6–24)
Bilirubin Total: 0.5 mg/dL (ref 0.0–1.2)
CO2: 25 mmol/L (ref 20–29)
Calcium: 9.8 mg/dL (ref 8.7–10.2)
Chloride: 100 mmol/L (ref 96–106)
Creatinine, Ser: 0.84 mg/dL (ref 0.76–1.27)
Globulin, Total: 2.4 g/dL (ref 1.5–4.5)
Glucose: 82 mg/dL (ref 70–99)
Potassium: 4.3 mmol/L (ref 3.5–5.2)
Sodium: 142 mmol/L (ref 134–144)
Total Protein: 7.2 g/dL (ref 6.0–8.5)
eGFR: 107 mL/min/1.73 (ref >59)

## 2024-04-10 MED ORDER — AMLODIPINE BESYLATE 10 MG PO TABS: 10.0000 mg | ORAL_TABLET | Freq: Every day | ORAL | 2 refills | Status: AC

## 2024-04-10 NOTE — Assessment & Plan Note (Addendum)
 Most recent labs reviewed, HDL 37 and LDL 113.  - He is encouraged to aim for at least 150 minutes of exercise per week - Follow heart healthy lifestyle - Increase fish intake and decrease intake of fried/processed foods.

## 2024-04-10 NOTE — Progress Notes (Signed)
 I,Victoria T Emmitt, CMA,acting as a neurosurgeon for Catheryn LOISE Slocumb, MD.,have documented all relevant documentation on the behalf of Catheryn LOISE Slocumb, MD,as directed by  Catheryn LOISE Slocumb, MD while in the presence of Catheryn LOISE Slocumb, MD.  Subjective:  Patient ID: Ethan Middleton , male    DOB: Dec 06, 1975 , 48 y.o.   MRN: [id number]  Chief Complaint  Patient presents with   Hypertension    Patient presents today for bpc. He reports recently establishing with [facility]. He currently takes Valsartan  320MG , hydrochlorothiazide  25MG  & Amlodipine  10mg  which he is currently out of. This appointment was to rule out if Amlodipine  is needed. Denies headache, chest pain & sob. He is currently out of Amlodipine  rx.  He also is to travel out of town tomorrow.    HPI     He presents today for refill of amlodipine . He also takes two other meds for BP. He reports compliance. He is traveling to [place] tomorrow and needs urgent refill. Denies headaches, chest pain and shortness of breath.  No other concerns. He plans to keep physical appt later this month.   Hypertension This is a chronic problem. The current episode started more than 1 year ago. The problem has been gradually improving since onset. The problem is controlled. Pertinent negatives include no blurred vision, chest pain or palpitations. Risk factors for coronary artery disease include dyslipidemia. Past treatments include calcium  channel blockers, diuretics and angiotensin blockers. The current treatment provides moderate improvement.     Past Medical History:  Diagnosis Date   Celiac disease    Depression 2018   Lexapro  has helped   GERD (gastroesophageal reflux disease) 2018   Omeprazole seems to create bloating   Hypertension    I want to check to see if I still need medication   Insomnia    Internal hemorrhoids    OSA (obstructive sleep apnea)    Plantar fasciitis    Sleep apnea      Family History  Problem Relation Age of Onset    Arthritis Maternal Uncle    Asthma Maternal Uncle    Diabetes Maternal Grandmother    Asthma Maternal Grandmother    Arthritis Maternal Grandmother    Colon polyps Neg Hx    Stomach cancer Neg Hx      Current Outpatient Medications:    diclofenac  (VOLTAREN ) 75 MG EC tablet, TAKE 1 TABLET(75 MG) BY MOUTH TWICE DAILY, Disp: 50 tablet, Rfl: 2   escitalopram  (LEXAPRO ) 10 MG tablet, Take 1 tablet (10 mg total) by mouth daily., Disp: 90 tablet, Rfl: 1   hydrochlorothiazide  (HYDRODIURIL ) 25 MG tablet, Take 1 tablet (25 mg total) by mouth daily., Disp: 90 tablet, Rfl: 1   valsartan  (DIOVAN ) 320 MG tablet, Take 1 tablet (320 mg total) by mouth daily., Disp: 90 tablet, Rfl: 1   zolpidem  (AMBIEN  CR) 12.5 MG CR tablet, Take 1 tablet (12.5 mg total) by mouth at bedtime as needed., Disp: 30 tablet, Rfl: 5   amLODipine  (NORVASC ) 10 MG tablet, Take 1 tablet (10 mg total) by mouth daily., Disp: 90 tablet, Rfl: 2   Allergies  Allergen Reactions   Gluten Meal     Celiac Disease     Review of Systems  Constitutional: Negative.   Eyes:  Negative for blurred vision.  Respiratory: Negative.    Cardiovascular: Negative.  Negative for chest pain and palpitations.  Gastrointestinal: Negative.   Skin: Negative.   Allergic/Immunologic: Negative.   Psychiatric/Behavioral: Negative.  Today's Vitals   04/10/24 1359  BP: 118/80  Pulse: 86  Temp: 98.3 F (36.8 C)  SpO2: 98%  Weight: 206 lb 12.8 oz (93.8 kg)  Height: 5' 7 (1.702 m)   Body mass index is 32.39 kg/m.  Wt Readings from Last 3 Encounters:  04/10/24 206 lb 12.8 oz (93.8 kg)  11/22/23 209 lb 12.8 oz (95.2 kg)  08/30/16 202 lb (91.6 kg)    The 10-year ASCVD risk score (Arnett DK, et al., 2019) is: 12.5%   Values used to calculate the score:     Age: 5 years     Clincally relevant sex: Male     Is Non-Hispanic African American: Yes     Diabetic: No     Tobacco smoker: Yes     Systolic Blood Pressure: 118 mmHg     Is BP  treated: Yes     HDL Cholesterol: 37 mg/dL     Total Cholesterol: 162 mg/dL  Objective:  Physical Exam Vitals and nursing note reviewed.  Constitutional:      General: He is not in acute distress.    Appearance: Normal appearance. He is obese.  HENT:     Head: Normocephalic and atraumatic.  Eyes:     Extraocular Movements: Extraocular movements intact.  Cardiovascular:     Rate and Rhythm: Normal rate and regular rhythm.     Pulses: Normal pulses.     Heart sounds: Normal heart sounds. No murmur heard. Pulmonary:     Effort: Pulmonary effort is normal. No respiratory distress.     Breath sounds: Normal breath sounds. No wheezing.  Musculoskeletal:     Cervical back: Normal range of motion.  Skin:    General: Skin is warm and dry.     Capillary Refill: Capillary refill takes less than 2 seconds.  Neurological:     General: No focal deficit present.     Mental Status: He is alert and oriented to person, place, and time.     Cranial Nerves: No cranial nerve deficit.     Motor: No weakness.         Assessment And Plan:   Assessment & Plan Primary hypertension Chronic, fair control. Goal BP is less than 130/80.  He will continue with amlodipine  10mg , refill sent to the pharmacy.  He will also continue with valsartan  320mg  daily and hydrochlorothiazide  25mg  daily.  - Encouraged to follow low sodium diet.  - F/u with PCP, [name].  Dyslipidemia Most recent labs reviewed, HDL 37 and LDL 113.  - He is encouraged to aim for at least 150 minutes of exercise per week - Follow heart healthy lifestyle - Increase fish intake and decrease intake of fried/processed foods.  Class 1 obesity due to excess calories with serious comorbidity and body mass index (BMI) of 32.0 to 32.9 in adult He is encouraged to strive for BMI less than 30 to decrease cardiac risk. Advised to aim for at least 150 minutes of exercise per week.   Orders Placed This Encounter  Procedures   CMP14+EGFR      Return if symptoms worsen or fail to improve.  Patient was given opportunity to ask questions. Patient verbalized understanding of the plan and was able to repeat key elements of the plan. All questions were answered to their satisfaction.    I, Catheryn LOISE Slocumb, MD, have reviewed all documentation for this visit. The documentation on 04/10/24 for the exam, diagnosis, procedures, and orders are all accurate and complete.  IF YOU HAVE BEEN REFERRED TO A SPECIALIST, IT MAY TAKE 1-2 WEEKS TO SCHEDULE/PROCESS THE REFERRAL. IF YOU HAVE NOT HEARD FROM US /SPECIALIST IN TWO WEEKS, PLEASE GIVE US  A CALL AT [phone].

## 2024-04-10 NOTE — Assessment & Plan Note (Addendum)
 He is encouraged to strive for BMI less than 30 to decrease cardiac risk. Advised to aim for at least 150 minutes of exercise per week.

## 2024-04-10 NOTE — Assessment & Plan Note (Addendum)
 Chronic, fair control. Goal BP is less than 130/80.  He will continue with amlodipine  10mg , refill sent to the pharmacy.  He will also continue with valsartan  320mg  daily and hydrochlorothiazide  25mg  daily.  - Encouraged to follow low sodium diet.  - F/u with PCP, [name].

## 2024-04-10 NOTE — Patient Instructions (Signed)
 Hypertension, Adult Hypertension is another name for high blood pressure. High blood pressure forces your heart to work harder to pump blood. This can cause problems over time. There are two numbers in a blood pressure reading. There is a top number (systolic) over a bottom number (diastolic). It is best to have a blood pressure that is below 120/80. What are the causes? The cause of this condition is not known. Some other conditions can lead to high blood pressure. What increases the risk? Some lifestyle factors can make you more likely to develop high blood pressure: Smoking. Not getting enough exercise or physical activity. Being overweight. Having too much fat, sugar, calories, or salt (sodium) in your diet. Drinking too much alcohol. Other risk factors include: Having any of these conditions: Heart disease. Diabetes. High cholesterol. Kidney disease. Obstructive sleep apnea. Having a family history of high blood pressure and high cholesterol. Age. The risk increases with age. Stress. What are the signs or symptoms? High blood pressure may not cause symptoms. Very high blood pressure (hypertensive crisis) may cause: Headache. Fast or uneven heartbeats (palpitations). Shortness of breath. Nosebleed. Vomiting or feeling like you may vomit (nauseous). Changes in how you see. Very bad chest pain. Feeling dizzy. Seizures. How is this treated? This condition is treated by making healthy lifestyle changes, such as: Eating healthy foods. Exercising more. Drinking less alcohol. Your doctor may prescribe medicine if lifestyle changes do not help enough and if: Your top number is above 130. Your bottom number is above 80. Your personal target blood pressure may vary. Follow these instructions at home: Eating and drinking  If told, follow the DASH eating plan. To follow this plan: Fill one half of your plate at each meal with fruits and vegetables. Fill one fourth of your plate  at each meal with whole grains. Whole grains include whole-wheat pasta, brown rice, and whole-grain bread. Eat or drink low-fat dairy products, such as skim milk or low-fat yogurt. Fill one fourth of your plate at each meal with low-fat (lean) proteins. Low-fat proteins include fish, chicken without skin, eggs, beans, and tofu. Avoid fatty meat, cured and processed meat, or chicken with skin. Avoid pre-made or processed food. Limit the amount of salt in your diet to less than 1,500 mg each day. Do not drink alcohol if: Your doctor tells you not to drink. You are pregnant, may be pregnant, or are planning to become pregnant. If you drink alcohol: Limit how much you have to: 0-1 drink a day for women. 0-2 drinks a day for men. Know how much alcohol is in your drink. In the [place] one drink equals one 12 oz bottle of beer (355 mL), one 5 oz glass of wine (148 mL), or one 1 oz glass of hard liquor (44 mL). Lifestyle  Work with your doctor to stay at a healthy weight or to lose weight. Ask your doctor what the best weight is for you. Get at least 30 minutes of exercise that causes your heart to beat faster (aerobic exercise) most days of the week. This may include walking, swimming, or biking. Get at least 30 minutes of exercise that strengthens your muscles (resistance exercise) at least 3 days a week. This may include lifting weights or doing Pilates. Do not smoke or use any products that contain nicotine or tobacco. If you need help quitting, ask your doctor. Check your blood pressure at home as told by your doctor. Keep all follow-up visits. Medicines Take over-the-counter and prescription medicines  only as told by your doctor. Follow directions carefully. Do not skip doses of blood pressure medicine. The medicine does not work as well if you skip doses. Skipping doses also puts you at risk for problems. Ask your doctor about side effects or reactions to medicines that you should watch  for. Contact a doctor if: You think you are having a reaction to the medicine you are taking. You have headaches that keep coming back. You feel dizzy. You have swelling in your ankles. You have trouble with your vision. Get help right away if: You get a very bad headache. You start to feel mixed up (confused). You feel weak or numb. You feel faint. You have very bad pain in your: Chest. Belly (abdomen). You vomit more than once. You have trouble breathing. These symptoms may be an emergency. Get help right away. Call 911. Do not wait to see if the symptoms will go away. Do not drive yourself to the hospital. Summary Hypertension is another name for high blood pressure. High blood pressure forces your heart to work harder to pump blood. For most people, a normal blood pressure is less than 120/80. Making healthy choices can help lower blood pressure. If your blood pressure does not get lower with healthy choices, you may need to take medicine. This information is not intended to replace advice given to you by your health care provider. Make sure you discuss any questions you have with your health care provider. Document Revised: 02/06/2021 Document Reviewed: 02/06/2021 Elsevier Patient Education  2024 [employer].

## 2024-04-11 ENCOUNTER — Ambulatory Visit: Payer: Self-pay | Admitting: Internal Medicine

## 2024-04-11 ENCOUNTER — Institutional Professional Consult (permissible substitution): Admitting: Neurology

## 2024-04-11 ENCOUNTER — Encounter: Payer: Self-pay | Admitting: Neurology

## 2024-04-11 ENCOUNTER — Ambulatory Visit: Admitting: Internal Medicine

## 2024-05-02 ENCOUNTER — Encounter: Payer: Self-pay | Admitting: Nurse Practitioner

## 2024-05-02 ENCOUNTER — Ambulatory Visit: Admitting: Nurse Practitioner

## 2024-05-02 VITALS — BP 130/80 | HR 73 | Temp 98.7°F | Ht 67.0 in | Wt 207.8 lb

## 2024-05-02 DIAGNOSIS — I10 Essential (primary) hypertension: Secondary | ICD-10-CM

## 2024-05-02 DIAGNOSIS — R222 Localized swelling, mass and lump, trunk: Secondary | ICD-10-CM | POA: Diagnosis not present

## 2024-05-02 DIAGNOSIS — Z2821 Immunization not carried out because of patient refusal: Secondary | ICD-10-CM

## 2024-05-02 DIAGNOSIS — E559 Vitamin D deficiency, unspecified: Secondary | ICD-10-CM

## 2024-05-02 DIAGNOSIS — Z0000 Encounter for general adult medical examination without abnormal findings: Secondary | ICD-10-CM

## 2024-05-02 DIAGNOSIS — E785 Hyperlipidemia, unspecified: Secondary | ICD-10-CM | POA: Diagnosis not present

## 2024-05-02 DIAGNOSIS — E66811 Obesity, class 1: Secondary | ICD-10-CM | POA: Diagnosis not present

## 2024-05-02 DIAGNOSIS — Z79899 Other long term (current) drug therapy: Secondary | ICD-10-CM

## 2024-05-02 DIAGNOSIS — G4733 Obstructive sleep apnea (adult) (pediatric): Secondary | ICD-10-CM

## 2024-05-02 DIAGNOSIS — R140 Abdominal distension (gaseous): Secondary | ICD-10-CM

## 2024-05-02 DIAGNOSIS — Z6832 Body mass index (BMI) 32.0-32.9, adult: Secondary | ICD-10-CM

## 2024-05-02 DIAGNOSIS — F5102 Adjustment insomnia: Secondary | ICD-10-CM | POA: Diagnosis not present

## 2024-05-02 DIAGNOSIS — Z125 Encounter for screening for malignant neoplasm of prostate: Secondary | ICD-10-CM

## 2024-05-02 DIAGNOSIS — Z13228 Encounter for screening for other metabolic disorders: Secondary | ICD-10-CM

## 2024-05-02 DIAGNOSIS — F4329 Adjustment disorder with other symptoms: Secondary | ICD-10-CM | POA: Diagnosis not present

## 2024-05-02 DIAGNOSIS — Z139 Encounter for screening, unspecified: Secondary | ICD-10-CM

## 2024-05-02 LAB — POCT URINALYSIS DIP (CLINITEK)
Bilirubin, UA: NEGATIVE
Blood, UA: NEGATIVE
Glucose, UA: NEGATIVE mg/dL
Ketones, POC UA: NEGATIVE mg/dL
Leukocytes, UA: NEGATIVE
Nitrite, UA: NEGATIVE
POC PROTEIN,UA: NEGATIVE
Spec Grav, UA: 1.01
Urobilinogen, UA: 0.2 U/dL
pH, UA: 6

## 2024-05-02 MED ORDER — VALSARTAN 320 MG PO TABS: 320.0000 mg | ORAL_TABLET | Freq: Every day | ORAL | 1 refills | Status: AC

## 2024-05-02 MED ORDER — ESCITALOPRAM OXALATE 10 MG PO TABS: 10.0000 mg | ORAL_TABLET | Freq: Every day | ORAL | 1 refills | Status: AC

## 2024-05-02 MED ORDER — HYDROCHLOROTHIAZIDE 25 MG PO TABS: 25.0000 mg | ORAL_TABLET | Freq: Every day | ORAL | 1 refills | Status: AC

## 2024-05-02 NOTE — Progress Notes (Signed)
 LILLETTE Kristeen JINNY Gladis, CMA,acting as a neurosurgeon for Ethan Ada, FNP.,have documented all relevant documentation on the behalf of Ethan Ada, FNP,as directed by  Ethan Ada, FNP while in the presence of Ethan Ada, FNP.  Subjective:   Patient ID: Ethan Middleton , male    DOB: [birth year] , 48 y.o.   MRN: [id number]  Chief Complaint  Patient presents with   Annual Exam    Patient presents today for HM, Patient reports compliance with medication. Patient denies any chest pain, SOB, or headaches. Patient would like to have a allergy test complete for his gluten allergy.      HPI  Discussed the use of AI scribe software for clinical note transcription with the patient, who gave verbal consent to proceed.  History of Present Illness Ethan Middleton is a 48 year old male who presents for an annual physical exam.  He has dorsal tendinitis diagnosed on March 22, 2024. He has not been exercising due to a month-long vacation but plans to resume every other day. He has reduced his carbohydrate intake and currently weighs 199 pounds after dietary changes.  He consumes wine approximately twice a week, a reduction from his previous average of four drinks per week. He smokes three to four cigarettes per day, which is also a reduction. He experiences nervousness when reducing smoking and drinking.  He has a history of celiac disease diagnosed via an allergy test in [place]  but is unsure of its current status. He experiences bloating and stomach pains, initially treated with omeprazole. He has not consulted a gastroenterologist recently and is interested in retesting for celiac disease.  He has moderate sleep apnea and has an upcoming appointment to bring in his old CPAP machine for reassessment. He has an upcoming appointment with [facility] for reassessment and an in-office test.  He takes escitalopram  for mood management, which he finds helpful in coping with work-related stress.  He has a habit of eating sweets to aid sleep, which he acknowledges may contribute to maintaining his weight around 200 pounds.  He is concerned about bumps on his chest, which cause self-consciousness, especially when traveling. He is interested in seeing a dermatologist for evaluation.  No issues with urination, swelling in feet or ankles, and no family history of prostate cancer. He has not had a colonoscopy in the last three years, with the last one being in [place] .  Past Medical History:  Diagnosis Date   Celiac disease    Depression 2018   Lexapro  has helped   GERD (gastroesophageal reflux disease) 2018   Omeprazole seems to create bloating   Hypertension    I want to check to see if I still need medication   Insomnia    Internal hemorrhoids    OSA (obstructive sleep apnea)    Plantar fasciitis    Sleep apnea      Family History  Problem Relation Age of Onset   Arthritis Maternal Uncle    Asthma Maternal Uncle    Diabetes Maternal Grandmother    Asthma Maternal Grandmother    Arthritis Maternal Grandmother    Colon polyps Neg Hx    Stomach cancer Neg Hx     Current Medications[1]   Allergies[2]   Men's preventive visit. Patient Health Questionnaire (PHQ-2) is  Flowsheet Row Office Visit from 05/02/2024 in [facility]  PHQ-2 Total Score 0  Patient is on a regular diet; has cut back on carbs. Exercise - every other day  goal since turning 48. Marital status: Married. Relevant history for alcohol use is:  Social History   Substance and Sexual Activity  Alcohol Use Not Currently   Alcohol/week: 2.0 standard drinks of alcohol   Types: 2 Glasses of wine per week   Relevant history for tobacco use is: Tobacco Use History[3].   Review of Systems  Constitutional: Negative.   HENT: Negative.    Eyes: Negative.   Respiratory: Negative.    Cardiovascular: Negative.   Gastrointestinal: Negative.   Endocrine: Negative.    Genitourinary:  Negative for frequency.  Musculoskeletal: Negative.   Allergic/Immunologic: Negative.   Neurological: Negative.   Hematological: Negative.   Psychiatric/Behavioral: Negative.       Today's Vitals   05/02/24 0952  BP: 130/80  Pulse: 73  Temp: 98.7 F (37.1 C)  TempSrc: Oral  Weight: 207 lb 12.8 oz (94.3 kg)  Height: 5' 7 (1.702 m)  PainSc: 0-No pain   Body mass index is 32.55 kg/m.  Wt Readings from Last 3 Encounters:  05/02/24 207 lb 12.8 oz (94.3 kg)  04/10/24 206 lb 12.8 oz (93.8 kg)  11/22/23 209 lb 12.8 oz (95.2 kg)    Objective:  Physical Exam Vitals and nursing note reviewed.  Constitutional:      General: He is not in acute distress.    Appearance: Normal appearance. He is obese.  HENT:     Head: Normocephalic and atraumatic.     Right Ear: Tympanic membrane, ear canal and external ear normal. There is no impacted cerumen.     Left Ear: Tympanic membrane, ear canal and external ear normal. There is no impacted cerumen.     Nose: Nose normal.     Mouth/Throat:     Mouth: Mucous membranes are moist.  Cardiovascular:     Rate and Rhythm: Normal rate and regular rhythm.     Pulses: Normal pulses.     Heart sounds: Normal heart sounds. No murmur heard. Pulmonary:     Effort: Pulmonary effort is normal. No respiratory distress.     Breath sounds: Normal breath sounds.  Abdominal:     General: Abdomen is flat. Bowel sounds are normal. There is no distension.     Palpations: Abdomen is soft.  Genitourinary:    Prostate: Normal.     Rectum: Guaiac result negative.  Musculoskeletal:        General: Normal range of motion.     Cervical back: Normal range of motion and neck supple.  Skin:    General: Skin is warm.     Capillary Refill: Capillary refill takes less than 2 seconds.  Neurological:     General: No focal deficit present.     Mental Status: He is alert and oriented to person, place, and time.  Psychiatric:        Mood and Affect:  Mood normal.        Behavior: Behavior normal.        Thought Content: Thought content normal.        Judgment: Judgment normal.        Assessment And Plan:    Encounter for annual health examination Assessment & Plan: Behavior modifications discussed and diet history reviewed.   Pt will continue to exercise regularly and modify diet with low GI, plant based foods and decrease intake of processed foods.  Recommend intake of daily multivitamin, Vitamin D , and calcium .  Recommend colonoscopy (up to date) for preventive screenings, as well as recommend immunizations that include  influenza, TDAP, and Shingles (declined)    Moderate obstructive sleep apnea Assessment & Plan: Moderate obstructive sleep apnea, potentially associated with weight issues. - Attend appointment with [facility] for CPAP reassessment and in-office test.   Primary hypertension Assessment & Plan: Blood pressure recorded at 130/80 mmHg, slightly elevated from previous reading of 118/80 mmHg. Possible dietary influence due to recent holiday period with increased salt intake. - Continue current antihypertensive medications: Amlodipine , Hydrochlorothiazide , Valsartan . - EKG done with anterolateral ST elevation HR 68  Orders: -     EKG 12-Lead -     POCT URINALYSIS DIP (CLINITEK) -     Microalbumin / creatinine urine ratio -     hydroCHLOROthiazide ; Take 1 tablet (25 mg total) by mouth daily.  Dispense: 90 tablet; Refill: 1 -     Valsartan ; Take 1 tablet (320 mg total) by mouth daily.  Dispense: 90 tablet; Refill: 1  Dyslipidemia Assessment & Plan: Cholesterol levels to be checked as part of routine lab work. - Ordered cholesterol panel.  Orders: -     Lipid panel  Vitamin D  insufficiency Assessment & Plan: Vitamin D  levels to be checked as part of routine lab work. - Ordered vitamin D  level.  Orders: -     VITAMIN D  25 Hydroxy (Vit-D Deficiency, Fractures)  Abdominal bloating Assessment &  Plan: Chronic abdominal bloating with previous diagnosis of celiac disease. Current management with omeprazole, but symptoms persist. - Referred to gastroenterologist for evaluation of bloating and potential celiac disease.  Orders: -     Ambulatory referral to Gastroenterology  Lump in chest Assessment & Plan: Presence of non-painful chest wall mass. Referral to dermatologist for further evaluation. - Ordered ultrasound of chest wall mass. - Referred to dermatologist for evaluation of chest wall mass.  Orders: -     US  CHEST SOFT TISSUE; Future -     Ambulatory referral to Dermatology  Adjustment insomnia Assessment & Plan: somnia managed with zolpidem  as needed. Discussed sleep habits and potential impact on weight management. - Continue zolpidem  as needed for insomnia.   Stress and adjustment reaction Assessment & Plan: Continued use of escitalopram  for mood stabilization, with perceived benefit in managing stress-related symptoms. - Continue escitalopram  10 mg oral daily.   Class 1 obesity due to excess calories with body mass index (BMI) of 32.0 to 32.9 in adult, unspecified whether serious comorbidity present Assessment & Plan: Weight management discussed, including dietary changes and exercise. Previous use of weight loss medications like Wegovy noted, but emphasis on lifestyle modifications as primary intervention. Discussed potential for Zepbound due to moderate sleep apnea, which may aid in weight loss. - Encouraged continuation of dietary modifications and regular exercise. - Discussed potential for Zepbound due to moderate sleep apnea.   Influenza vaccination declined  Herpes zoster vaccination declined  Pneumococcal vaccination declined  Encounter for screening  Other long term (current) drug therapy -     CBC with Differential/Platelet  Encounter for screening for metabolic disorder -     Hemoglobin A1c  Encounter for prostate cancer screening -      PSA  Other orders -     Escitalopram  Oxalate; Take 1 tablet (10 mg total) by mouth daily.  Dispense: 90 tablet; Refill: 1     Return for 1 year physical, 6 month bp check. Patient was given opportunity to ask questions. Patient verbalized understanding of the plan and was able to repeat key elements of the plan. All questions were answered to their satisfaction.  Ethan Ada, FNP  I, Ethan Ada, FNP, have reviewed all documentation for this visit. The documentation on 05/02/2024 for the exam, diagnosis, procedures, and orders are all accurate and complete.      [1]  Current Outpatient Medications:    amLODipine  (NORVASC ) 10 MG tablet, Take 1 tablet (10 mg total) by mouth daily., Disp: 90 tablet, Rfl: 2   diclofenac  (VOLTAREN ) 75 MG EC tablet, TAKE 1 TABLET(75 MG) BY MOUTH TWICE DAILY, Disp: 50 tablet, Rfl: 2   zolpidem  (AMBIEN  CR) 12.5 MG CR tablet, Take 1 tablet (12.5 mg total) by mouth at bedtime as needed., Disp: 30 tablet, Rfl: 5   escitalopram  (LEXAPRO ) 10 MG tablet, Take 1 tablet (10 mg total) by mouth daily., Disp: 90 tablet, Rfl: 1   hydrochlorothiazide  (HYDRODIURIL ) 25 MG tablet, Take 1 tablet (25 mg total) by mouth daily., Disp: 90 tablet, Rfl: 1   valsartan  (DIOVAN ) 320 MG tablet, Take 1 tablet (320 mg total) by mouth daily., Disp: 90 tablet, Rfl: 1 [2]  Allergies Allergen Reactions   Gluten Meal     Celiac Disease  [3]  Social History Tobacco Use  Smoking Status Every Day   Current packs/day: 0.50   Average packs/day: 0.5 packs/day for 20.0 years (10.0 ttl pk-yrs)   Types: Cigarettes  Smokeless Tobacco Never  Tobacco Comments   05/02/24 - smokes 3-4 cigarettes per day.

## 2024-05-03 LAB — LIPID PANEL
Chol/HDL Ratio: 4.5 ratio (ref 0.0–5.0)
Cholesterol, Total: 196 mg/dL (ref 100–199)
HDL: 44 mg/dL
LDL Chol Calc (NIH): 137 mg/dL — ABNORMAL HIGH (ref 0–99)
Triglycerides: 83 mg/dL (ref 0–149)
VLDL Cholesterol Cal: 15 mg/dL (ref 5–40)

## 2024-05-03 LAB — VITAMIN D 25 HYDROXY (VIT D DEFICIENCY, FRACTURES): Vit D, 25-Hydroxy: 25.6 ng/mL — ABNORMAL LOW (ref 30.0–100.0)

## 2024-05-03 LAB — PSA: Prostate Specific Ag, Serum: 0.8 ng/mL (ref 0.0–4.0)

## 2024-05-03 LAB — CBC WITH DIFFERENTIAL/PLATELET
Basophils Absolute: 0.1 x10E3/uL (ref 0.0–0.2)
Basos: 1 %
EOS (ABSOLUTE): 0.1 x10E3/uL (ref 0.0–0.4)
Eos: 1 %
Hematocrit: 47.9 % (ref 37.5–51.0)
Hemoglobin: 15.3 g/dL (ref 13.0–17.7)
Immature Grans (Abs): 0 x10E3/uL (ref 0.0–0.1)
Immature Granulocytes: 0 %
Lymphocytes Absolute: 2.3 x10E3/uL (ref 0.7–3.1)
Lymphs: 38 %
MCH: 27.8 pg (ref 26.6–33.0)
MCHC: 31.9 g/dL (ref 31.5–35.7)
MCV: 87 fL (ref 79–97)
Monocytes Absolute: 0.5 x10E3/uL (ref 0.1–0.9)
Monocytes: 8 %
Neutrophils Absolute: 3.2 x10E3/uL (ref 1.4–7.0)
Neutrophils: 52 %
Platelets: 394 x10E3/uL (ref 150–450)
RBC: 5.51 x10E6/uL (ref 4.14–5.80)
RDW: 13.5 % (ref 11.6–15.4)
WBC: 6.2 x10E3/uL (ref 3.4–10.8)

## 2024-05-03 LAB — HEMOGLOBIN A1C
Est. average glucose Bld gHb Est-mCnc: 120 mg/dL
Hgb A1c MFr Bld: 5.8 % — ABNORMAL HIGH (ref 4.8–5.6)

## 2024-05-03 LAB — MICROALBUMIN / CREATININE URINE RATIO
Creatinine, Urine: 69.1 mg/dL
Microalb/Creat Ratio: <4 mg/g{creat} (ref 0–29)
Microalbumin, Urine: <3 ug/mL

## 2024-05-08 ENCOUNTER — Ambulatory Visit: Payer: Self-pay | Admitting: Nurse Practitioner

## 2024-05-08 DIAGNOSIS — R222 Localized swelling, mass and lump, trunk: Secondary | ICD-10-CM | POA: Insufficient documentation

## 2024-05-08 DIAGNOSIS — F4329 Adjustment disorder with other symptoms: Secondary | ICD-10-CM | POA: Insufficient documentation

## 2024-05-08 NOTE — Assessment & Plan Note (Signed)
 Chronic abdominal bloating with previous diagnosis of celiac disease. Current management with omeprazole, but symptoms persist. - Referred to gastroenterologist for evaluation of bloating and potential celiac disease.

## 2024-05-08 NOTE — Assessment & Plan Note (Signed)
 Moderate obstructive sleep apnea, potentially associated with weight issues. - Attend appointment with [facility] for CPAP reassessment and in-office test.

## 2024-05-08 NOTE — Assessment & Plan Note (Signed)
 Cholesterol levels to be checked as part of routine lab work. - Ordered cholesterol panel.

## 2024-05-08 NOTE — Assessment & Plan Note (Signed)
 Presence of non-painful chest wall mass. Referral to dermatologist for further evaluation. - Ordered ultrasound of chest wall mass. - Referred to dermatologist for evaluation of chest wall mass.

## 2024-05-08 NOTE — Assessment & Plan Note (Signed)
 Vitamin D  levels to be checked as part of routine lab work. - Ordered vitamin D  level.

## 2024-05-08 NOTE — Assessment & Plan Note (Signed)
 Behavior modifications discussed and diet history reviewed.   Pt will continue to exercise regularly and modify diet with low GI, plant based foods and decrease intake of processed foods.  Recommend intake of daily multivitamin, Vitamin D , and calcium .  Recommend colonoscopy (up to date) for preventive screenings, as well as recommend immunizations that include influenza, TDAP, and Shingles (declined)

## 2024-05-08 NOTE — Assessment & Plan Note (Signed)
 Weight management discussed, including dietary changes and exercise. Previous use of weight loss medications like Wegovy noted, but emphasis on lifestyle modifications as primary intervention. Discussed potential for Zepbound due to moderate sleep apnea, which may aid in weight loss. - Encouraged continuation of dietary modifications and regular exercise. - Discussed potential for Zepbound due to moderate sleep apnea.

## 2024-05-08 NOTE — Assessment & Plan Note (Signed)
 somnia managed with zolpidem  as needed. Discussed sleep habits and potential impact on weight management. - Continue zolpidem  as needed for insomnia.

## 2024-05-08 NOTE — Assessment & Plan Note (Addendum)
 Blood pressure recorded at 130/80 mmHg, slightly elevated from previous reading of 118/80 mmHg. Possible dietary influence due to recent holiday period with increased salt intake. - Continue current antihypertensive medications: Amlodipine , Hydrochlorothiazide , Valsartan . - EKG done with anterolateral ST elevation HR 68

## 2024-05-08 NOTE — Assessment & Plan Note (Signed)
 Continued use of escitalopram  for mood stabilization, with perceived benefit in managing stress-related symptoms. - Continue escitalopram  10 mg oral daily.

## 2024-05-12 ENCOUNTER — Ambulatory Visit
Admission: RE | Admit: 2024-05-12 | Discharge: 2024-05-12 | Disposition: A | Source: Ambulatory Visit | Attending: Nurse Practitioner | Admitting: Nurse Practitioner

## 2024-05-12 DIAGNOSIS — R222 Localized swelling, mass and lump, trunk: Secondary | ICD-10-CM

## 2024-05-16 ENCOUNTER — Ambulatory Visit: Admitting: Nurse Practitioner

## 2024-05-23 ENCOUNTER — Other Ambulatory Visit: Payer: Self-pay

## 2024-05-23 MED ORDER — VITAMIN D (ERGOCALCIFEROL) 1.25 MG (50000 UNIT) PO CAPS: 50000.0000 [IU] | ORAL_CAPSULE | ORAL | 1 refills | Status: AC

## 2024-05-23 NOTE — Telephone Encounter (Signed)
 Yes send the 50,000 units once a week with 12 capsules, 1 refill.

## 2024-05-25 ENCOUNTER — Encounter

## 2024-06-02 ENCOUNTER — Ambulatory Visit
Admission: RE | Admit: 2024-06-02 | Discharge: 2024-06-02 | Disposition: A | Discharge status: Home / Self Care | Source: Ambulatory Visit

## 2024-06-02 VITALS — BP 118/80 | HR 84 | Temp 98.3°F | Resp 17

## 2024-06-02 DIAGNOSIS — M5441 Lumbago with sciatica, right side: Secondary | ICD-10-CM

## 2024-06-02 MED ORDER — KETOROLAC TROMETHAMINE 30 MG/ML IJ SOLN
30.0000 mg | Freq: Once | INTRAMUSCULAR | Status: AC
  Administered 2024-06-02: 30 mg via INTRAMUSCULAR

## 2024-06-02 MED ORDER — METHOCARBAMOL 750 MG PO TABS: 750.0000 mg | ORAL_TABLET | Freq: Four times a day (QID) | ORAL | 0 refills | Status: AC | PRN

## 2024-06-02 MED ORDER — METHYLPREDNISOLONE 4 MG PO TBPK: ORAL_TABLET | ORAL | 0 refills | Status: AC

## 2024-06-02 NOTE — ED Triage Notes (Addendum)
 Pt c/o right upper leg pain that starts in lower back and runs down leg. Pain began 3 weeks ago. Denies any known injury. Pt does a lot of traveling for work.   Denies any warmth or redness in thigh.   Pt is concerned about blood clot and is asking to get a DVT ultrasound.

## 2024-06-02 NOTE — Discharge Instructions (Signed)
" °  You were evaluated for right lower back muscle spasms with pain that travels down the leg (sciatica). This is commonly caused by muscle strain and irritation of a nerve in the lower back.  Medications  - Toradol  was given in the office today to help with pain and inflammation. - Ibuprofen 600 mg: Start tomorrow. Take every 6 hours with food for pain and inflammation.   - Do not take any other NSAIDs (like naproxen or aspirin for pain) today.  - Medrol  Dose Pack (steroid): Take exactly as directed on the package (6 pills on day 1, then decreasing each day). This helps reduce inflammation.  - Methocarbamol  750 mg: Take up to four times a day as needed for muscle spasms.   - This medication may cause sleepiness--use caution with driving or operating machinery.  What You Can Do at Home - Heat therapy: Apply a heating pad to your lower back for 15-20 minutes, 2-3 times a day. - Topical pain relief: You may use over-the-counter creams or patches (such as menthol or lidocaine). - Stretching: Start gentle lower-back and hamstring stretches as pain allows. Avoid movements that increase pain. - Massage: Gentle massage to the lower back muscles may help relieve tightness. - Activity: Stay lightly active as tolerated. Avoid heavy lifting, bending, or twisting.  What to Expect - Most people begin to feel improvement over the next several days to 1-2 weeks. - Some soreness or stiffness may persist while healing.  Call or Seek Care Right Away If You Have - Increasing weakness, numbness, or tingling in the leg - Trouble controlling your bladder or bowels - Numbness in the groin or saddle area - Severe pain that is not improving   "

## 2024-06-05 ENCOUNTER — Other Ambulatory Visit: Payer: Self-pay | Admitting: Nurse Practitioner

## 2024-06-05 DIAGNOSIS — M5441 Lumbago with sciatica, right side: Secondary | ICD-10-CM

## 2024-06-05 DIAGNOSIS — R222 Localized swelling, mass and lump, trunk: Secondary | ICD-10-CM

## 2024-06-16 ENCOUNTER — Ambulatory Visit

## 2025-01-03 ENCOUNTER — Ambulatory Visit: Admitting: Physician Assistant

## 2025-05-07 ENCOUNTER — Ambulatory Visit: Payer: Self-pay | Admitting: Nurse Practitioner
